# Patient Record
Sex: Female | Born: 1975 | Race: White | Hispanic: No | Marital: Married | Smoking: Never smoker
Health system: Southern US, Community
[De-identification: ages and names within clinical notes are randomized; demographics above are authoritative.]

## PROBLEM LIST (undated history)

## (undated) DIAGNOSIS — C4490 Unspecified malignant neoplasm of skin, unspecified: Secondary | ICD-10-CM

## (undated) HISTORY — DX: Unspecified malignant neoplasm of skin, unspecified: C44.90

## (undated) HISTORY — PX: ABDOMINAL HYSTERECTOMY: SHX81

## (undated) HISTORY — PX: SKIN CANCER EXCISION: SHX779

---

## 2004-11-27 ENCOUNTER — Observation Stay (HOSPITAL_COMMUNITY): Admission: RE | Admit: 2004-11-27 | Discharge: 2004-11-28 | Payer: Self-pay | Admitting: Obstetrics and Gynecology

## 2008-04-30 ENCOUNTER — Ambulatory Visit (HOSPITAL_COMMUNITY): Admission: RE | Admit: 2008-04-30 | Discharge: 2008-04-30 | Payer: Self-pay | Admitting: Obstetrics and Gynecology

## 2008-11-29 ENCOUNTER — Ambulatory Visit (HOSPITAL_COMMUNITY): Admission: RE | Admit: 2008-11-29 | Discharge: 2008-11-29 | Payer: Self-pay | Admitting: Obstetrics and Gynecology

## 2009-05-09 ENCOUNTER — Ambulatory Visit (HOSPITAL_COMMUNITY): Admission: RE | Admit: 2009-05-09 | Discharge: 2009-05-09 | Payer: Self-pay | Admitting: Obstetrics and Gynecology

## 2010-02-28 IMAGING — US US BREAST*L*
1 series · 6 of 6 positions shown · non-contrast
Comparison: 04/30/2008

CLINICAL DATA: The patient returns to evaluate the left breast.

LEFT BREAST ULTRASOUND

[Series 1: us breast*left* · 0.07mm/px · 6 of 6 slices shown]
[im 1/6]
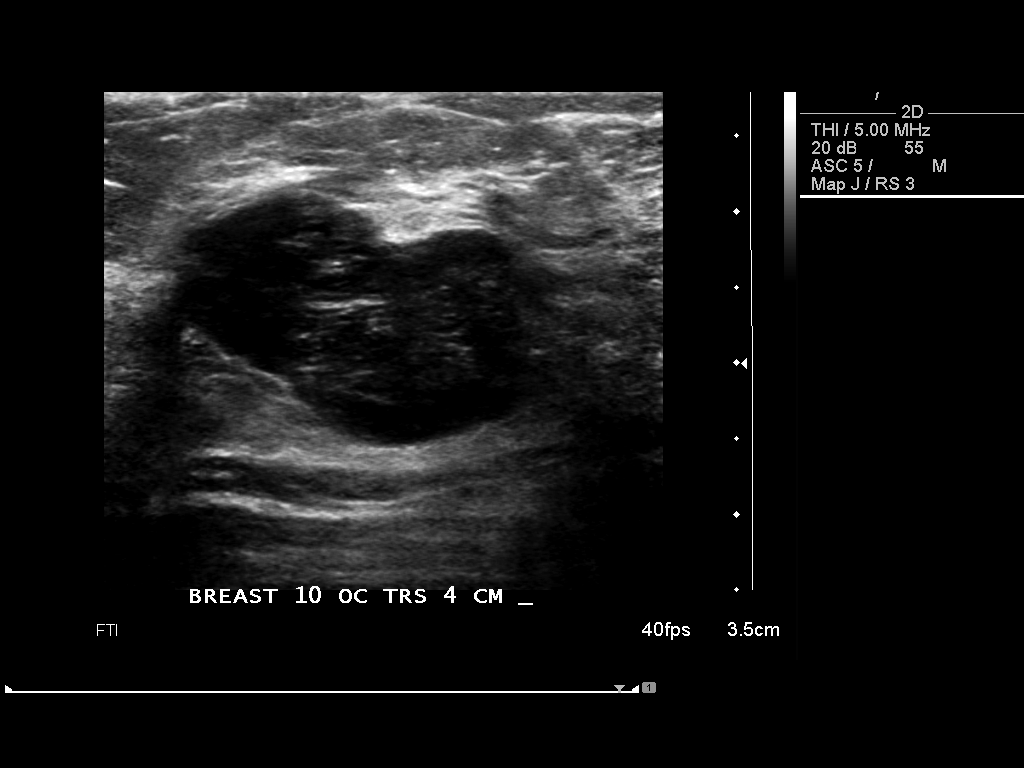
[im 2/6]
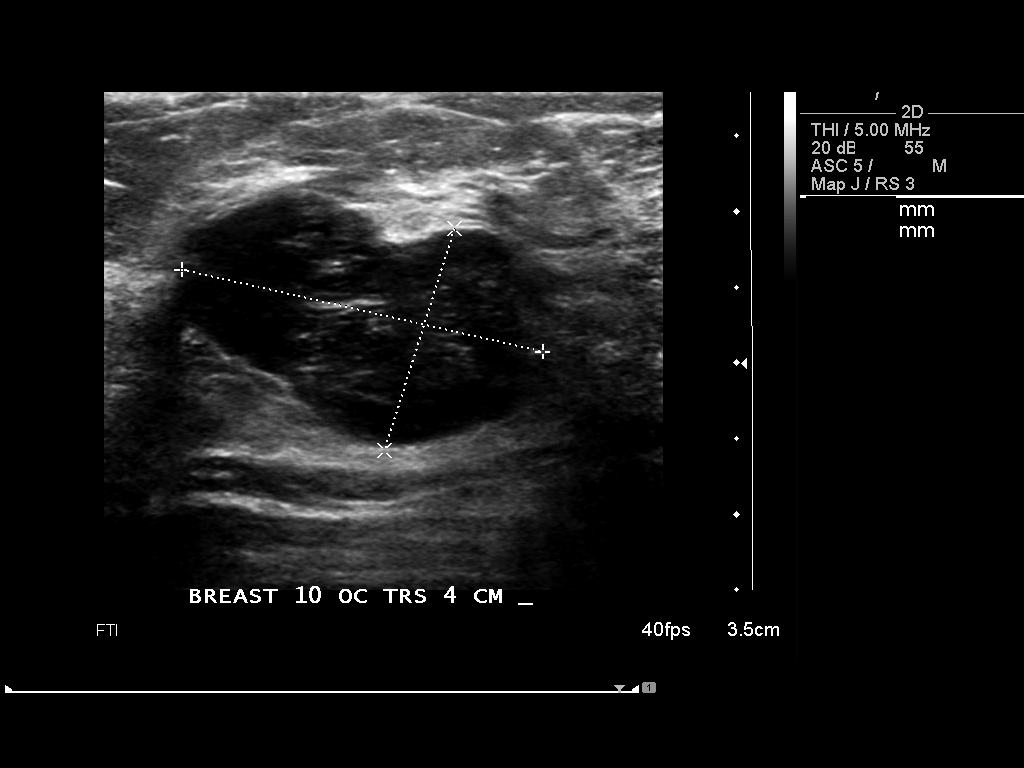
[im 3/6]
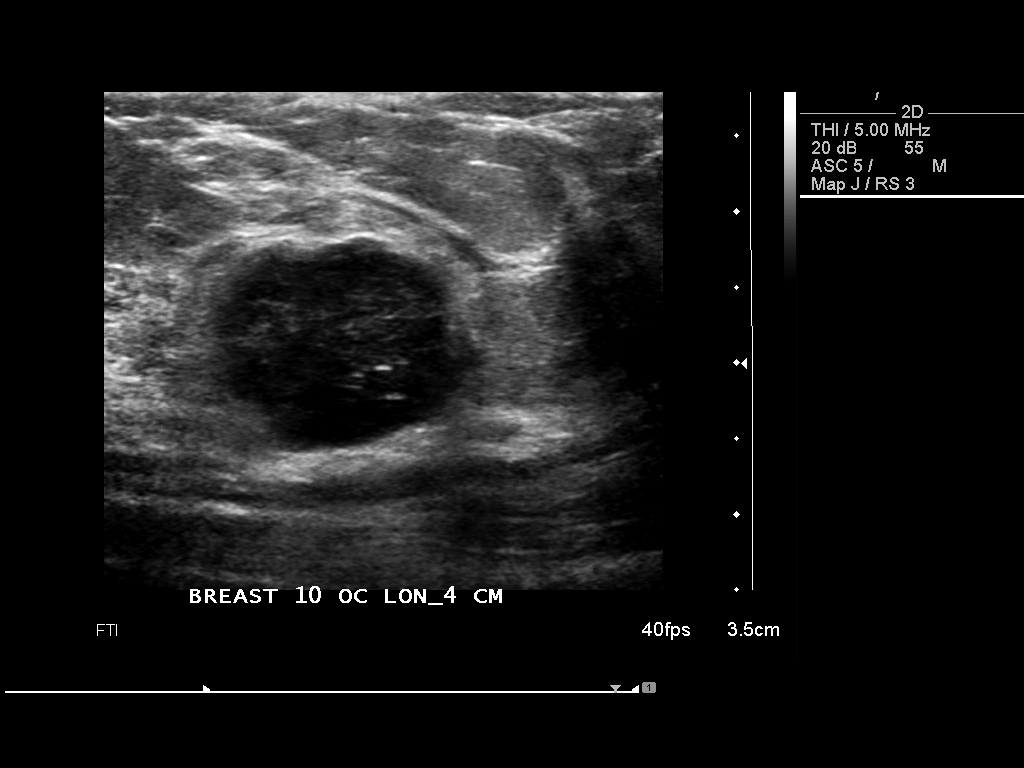
[im 4/6]
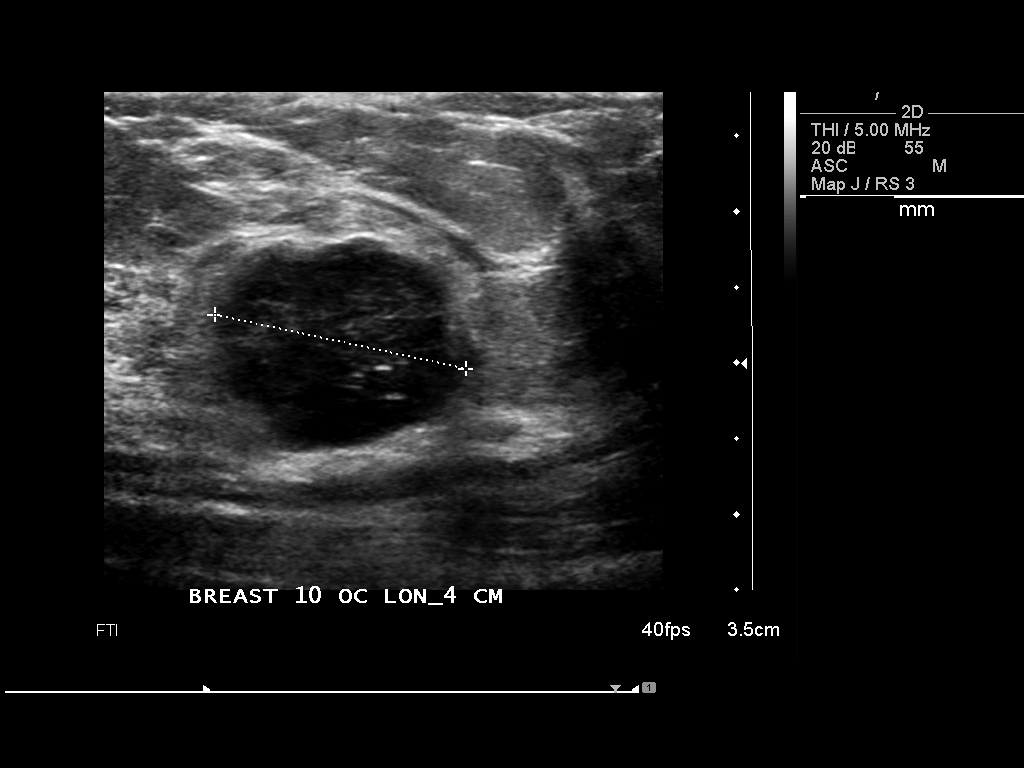
[im 5/6]
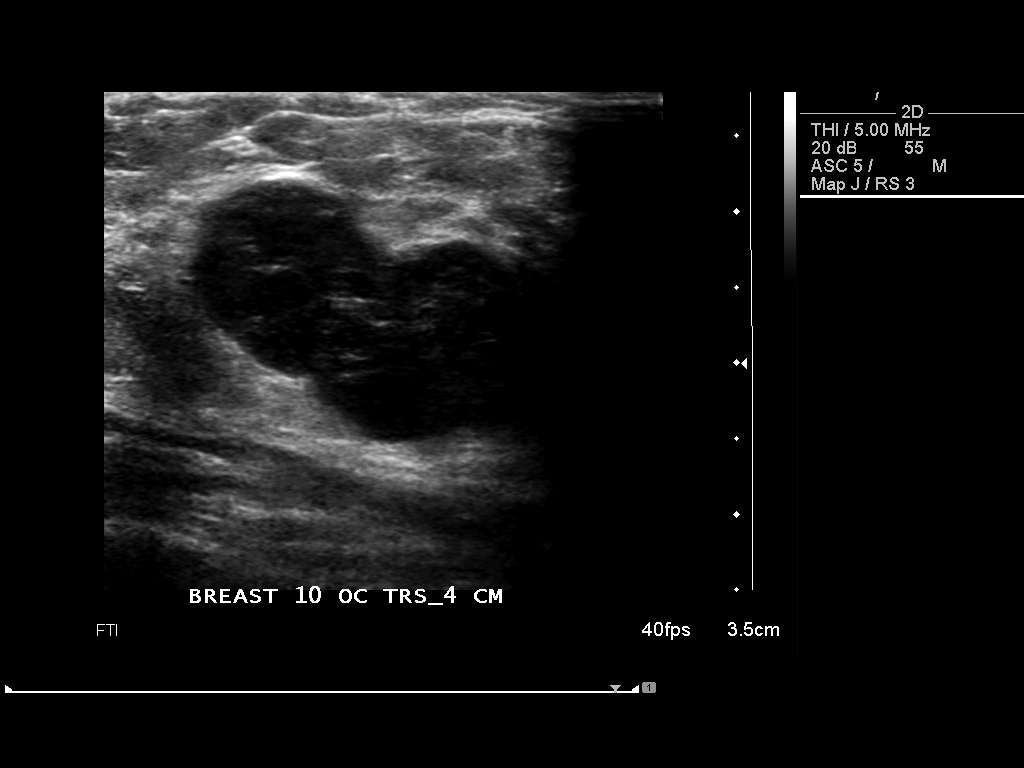
[im 6/6]
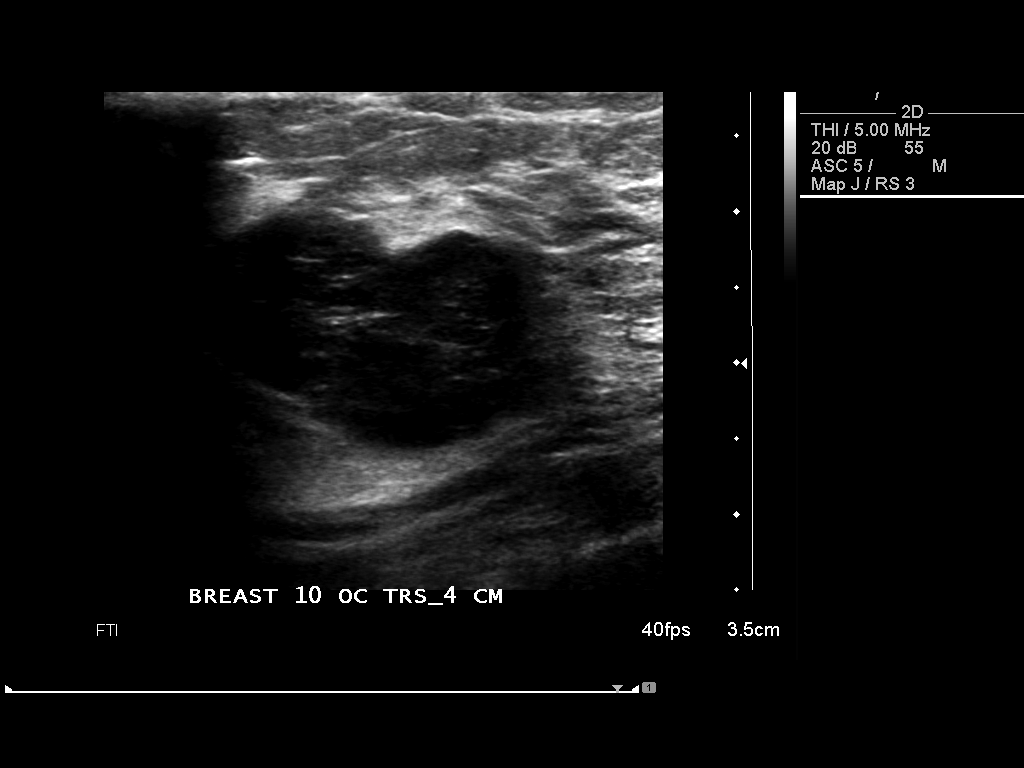

[6 of 6 positions shown; findings below may reference images not displayed]

On physical exam, I palpate a rounded mobile mass in the 10 o'clock
position of the left breast.
FINDINGS: Ultrasound is performed, showing a hypoechoic
circumscribed mass in the 10 o'clock position of the left breast 4
cm from the nipple.  This mass measures 2.4 x 1.5 x 1.7 cm and is
associated with increased through transmission.  Findings are
consistent with a benign fibroadenoma. No suspicious abnormalities
are identified within this portion of the breast.
IMPRESSION: Stable, benign appearing mass consistent with fibroadenoma.  Follow-
up left breast ultrasound is suggested in 6 months.

BI-RADS CATEGORY 3:  Probably benign finding(s) - short interval
follow-up suggested.

## 2011-01-30 NOTE — Op Note (Signed)
[name], [name]            ACCOUNT NO.:  [id number]   MEDICAL RECORD NO.:  [id number]          PATIENT TYPE:  OBV   LOCATION:  A402                          FACILITY:  [facility]   PHYSICIAN:  Tilda Burrow, M.D. DATE OF BIRTH:  29-Mar-1976   DATE OF PROCEDURE:  11/27/2004  DATE OF DISCHARGE:  11/28/2004                                 OPERATIVE REPORT   PREOPERATIVE DIAGNOSIS:  Pelvic relaxation, first degree uterine descensus,  dyspareunia.   POSTOPERATIVE DIAGNOSIS:  Pelvic relaxation, first degree uterine descensus,  dyspareunia.   PROCEDURE:  Vaginal hysterectomy.   SURGEON:  Tilda Burrow, M.D.   ASSISTANT:  __________, CST.   ANESTHESIA:  General.   COMPLICATIONS:  None.   ESTIMATED BLOOD LOSS:  Less than 200 mL.   FINDINGS:  A bulbous cervix, normal-appearing tubes and ovaries, no evidence  of endometriosis, relaxed pelvic structures.   DETAILS OF PROCEDURE:  The patient was taken to the operating room, prepped  and draped for vaginal procedure with legs supported in the high lithotomy  support position with candy-cane stirrups. After prepping and draping with  vaginal bib in place, single Lahey thyroid tenaculum was used to grasp the  anterior lip of the cervix. Anterior incision was made in the cervical  vaginal fornix from about 9 o'clock to 3 o'clock, and the bladder flap was  elevated upward. We could not enter the peritoneal cavity anteriorly but  were able to push the bladder quite high. Posteriorly, a posterior colpotomy  incision was made followed by placement of a long weighted speculum into the  posterior cul-de-sac. The left uterosacral ligament was clamped, cut, and  suture ligated with 0 chromic and tagged. The right uterosacral was treated  similarly. At this point, you could finish scoring the vaginal epithelium at  3 and 9 o'clock and completely expose the underlying cardial ligaments. The  lower cardinal ligaments were grasped on each side  with curved Heaney  clamps, transected with Mayo scissors, and sutured with 0 chromic. At this  point, reassessment of the anterior colpotomy incision potential was made,  and were able to enter anteriorly as well as posteriorly. A curved Zeppelin  clamp was then used across the upper cardinal ligament, clamping, cutting,  and suturing uterine vessels. Marched up the broad ligament using small  bites with a Zeppelin clamp, Mayo scissor transection, and 0 chromic suture  ligature. Upon reaching the level of the round ligament and ovarian  ligament, a cross clamping was performed across the tubal stump, round  ligament, uterosacral ligaments, and utero-ovarian ligament which were then  incorporated in a single pedicle. This was performed on either side and  tagged. At this point, we inspected the pelvis using sponge stick to abduct  lateral tissues along with lateral vaginal retractors. Hemostasis was used,  quite good. The ovaries were clearly visible, including ovulation stigmata  on one of the ovaries. We then proceeded with placement of a 3-0 Prolene  permanent monofilament suture high end of the uterosacrals on either side,  pulling them toward the midline. The vaginal peritoneum was closed fully  with bladder  posteriorly attaching over the cuff. The uterosacral ligaments  were then pulled in the midline and ligated. The remainder of the cuff was  closed in a vaginal fashion with good tissue support obtained. Sponge and  needle counts were correct, and patient then went to recovery room in good  condition.      JVF/MEDQ  D:  12/04/2004  T:  12/05/2004  Job:  [id number]   cc:   [facility]

## 2011-01-30 NOTE — H&P (Signed)
[name], [name]            ACCOUNT NO.:  [id number]   MEDICAL RECORD NO.:  [id number]          PATIENT TYPE:  AMB   LOCATION:                                 FACILITY:   PHYSICIAN:  Tilda Burrow, M.D. DATE OF BIRTH:  04-Feb-1976   DATE OF ADMISSION:  11/27/2004  DATE OF DISCHARGE:  LH                                HISTORY & PHYSICAL   ADMITTING DIAGNOSES:  1.  Dyspareunia.  2.  Uterine retroversion.  3.  Chronic back pain seemingly related to menses.  4.  Urinary frequency, responding to Ditropan (urge incontinence).   HISTORY OF PRESENT ILLNESS:  This 35 year old female, gravida 2, para 2,  with no desire for future childbearing, LMP November 12, 2004, is admitted at  this time for vaginal hysterectomy due to a combination of menstrual  discomfort during her periods, painful intercourse with spotting and  bleeding after intercourse.  She has had normal Pap smears.  She has had  ultrasound which evaluated the pelvic structures and showed a retroverted  retroflexed uterus measuring 5.5-cm in length with normal endometrial  stripe.  She has first degree uterine descensus on clinical exam.  There is  ovarian ultrasound showing normal ovaries bilaterally that are not the  source of pain.  She has been seen in our office, evaluated including  urinalysis which was negative.  Pelvic exam reproduces the dyspareunia by  tender uterine contact in the anterior cervical vaginal fornix that seems to  reproduce the dyspareunia in patient's perception.  She is not a candidate  for further birth control pill use to control her dysmenorrhea because all  birth control pills __________  effect libido.  The patient requests vaginal  hysterectomy for which she has been counseled and is considered reasonable  treatment.  She has a normal Pap smear, July 2005.  Negative GC and  Chlamydia cultures.  The patient had specific counseling to emphasize that  we can not absolutely guarantee resolution  of dyspareunia but that given the  cervix and uterus will be removed and those are the sources of presumed  discomfort, the likelihood is great that she will benefit.  She has had an  empiric trial of Ditropan for urinary frequency of 3-5 times nightly and  this reduced her nocturia to twice nightly.  She is satisfied with this.  She wears a pad only with a bad cold and high impact aerobics such as a  trampoline.  The possibility that complications could result in additional  surgery with specific mention of bladder or bowel as adjacent organs to be  concerned about has been discussed.   PAST MEDICAL HISTORY:  Benign.   SURGICAL HISTORY:  Negative.   INJURIES:  Broken ankle, 2004.   PHYSICAL EXAMINATION:  VITAL SIGNS:  Height 5 foot 4 inches, weight 168,  blood pressure 108/70, pulse 70s.  GENERAL:  She is a healthy appearing Caucasian female, alert, oriented,  appropriate, with acknowledged understanding or procedure.  Questions  answered to the patient's satisfaction is confirmed at day surgery November 26, 2004.  She has had a bowel prep.  HEENT:  Pupils equal, round, reactive.  NECK:  Supple.  Normal thyroid.  CHEST:  Clear to auscultation.  HEART:  Unremarkable.  LUNGS:  Unremarkable.  ABDOMEN:  Slim without masses.  EXTERNAL GENITALIA:  __________  first degree uterine descensus.  Uterus  retroverted.  Adnexa negative for masses.   REVIEW OF SYSTEMS:  Positive for chronic back pain times years with a  negative workup.  Dysmenorrhea results in back pain at the time of menses.  There has been no clinical history suggestive of endometriosis.   PLAN:  Vaginal hysterectomy, November 27, 2004.      JVF/MEDQ  D:  11/26/2004  T:  11/26/2004  Job:  [id number]   cc:   Tilda Burrow, M.D.  [address] [place]  [place] [address]  Fax: [phone]   Selinda Flavin  [address]  [place]  [place] [address]  Fax: [phone]

## 2013-05-08 ENCOUNTER — Ambulatory Visit (INDEPENDENT_AMBULATORY_CARE_PROVIDER_SITE_OTHER): Payer: BC Managed Care – PPO | Admitting: Obstetrics and Gynecology

## 2013-05-08 ENCOUNTER — Encounter: Payer: Self-pay | Admitting: Obstetrics and Gynecology

## 2013-05-08 VITALS — BP 110/82 | Ht 64.5 in | Wt 170.4 lb

## 2013-05-08 DIAGNOSIS — D242 Benign neoplasm of left breast: Secondary | ICD-10-CM

## 2013-05-08 DIAGNOSIS — Z01419 Encounter for gynecological examination (general) (routine) without abnormal findings: Secondary | ICD-10-CM

## 2013-05-08 NOTE — Progress Notes (Signed)
  Assessment:  Annual Gyn Exam Longstanding l breast fibroadenoma   Plan:  1. pap smear not required 2. return annually or prn 3   , would advise l br u/s Subjective:  [name] is a 37 y.o. female No obstetric history on file. who presents for annual exam. No LMP recorded. Patient has had a hysterectomy. The patient has complaints today of stable fibrodadenoma l breast, s/p hyst with vag dryness  The following portions of the patient's history were reviewed and updated as appropriate: allergies, current medications, past family history, past medical history, past social history, past surgical history and problem list.  Review of Systems Constitutional: negative Gastrointestinal: negative Genitourinary: vag dryness. No ui nocturia x3-4   Objective:  BP 110/82  Ht 5' 4.5" (1.638 m)  Wt 170 lb 6.4 oz (77.293 kg)  BMI 28.81 kg/m2   BMI: Body mass index is 28.81 kg/(m^2).  General Appearance: Alert, appropriate appearance for age. No acute distress HEENT: Grossly normal Neck / Thyroid:  Cardiovascular: RRR; normal S1, S2, no murmur Lungs: CTA bilaterally Back: No CVAT Breast Exam: lumps, left Gastrointestinal: Soft, non-tender, no masses or organomegaly Pelvic Exam: Vaginal: normal mucosa without prolapse or lesions Adnexa: normal bimanual exam Uterus: absent Rectovaginal: not indicated Lymphatic Exam: Non-palpable nodes in neck, clavicular, axillary, or inguinal regions Skin: no rash or abnormalities Neurologic: Normal gait and speech, no tremor  Psychiatric: Alert and oriented, appropriate affect.  Urinalysis:normal and Not done  Christin Bach. MD Pgr [phone] 12:16 PM

## 2014-09-14 HISTORY — PX: BREAST BIOPSY: SHX20

## 2015-02-21 ENCOUNTER — Ambulatory Visit (INDEPENDENT_AMBULATORY_CARE_PROVIDER_SITE_OTHER): Payer: BLUE CROSS/BLUE SHIELD | Admitting: Obstetrics and Gynecology

## 2015-02-21 ENCOUNTER — Encounter: Payer: Self-pay | Admitting: Obstetrics and Gynecology

## 2015-02-21 VITALS — BP 132/82 | HR 60 | Ht 63.0 in | Wt 193.2 lb

## 2015-02-21 DIAGNOSIS — Z01419 Encounter for gynecological examination (general) (routine) without abnormal findings: Secondary | ICD-10-CM

## 2015-02-21 DIAGNOSIS — Z139 Encounter for screening, unspecified: Secondary | ICD-10-CM

## 2015-02-21 NOTE — Progress Notes (Signed)
Patient ID: [name], female   DOB: [birth year], 39 y.o.   MRN: [id number]  Assessment:  Annual Gyn Exam Fibroadenoma of no concern at this time. Follow up with mammogram in December. Plan:  1. pap smear not done due to total hysterectomy. Not needed. 2. return annually or prn 3    Pt to have mammogram done before 39th birthday this year, in December, and send to me for report.  Subjective:  [name] is a 39 y.o. female with a history of total abdominal hysterectomy, who presents for annual exam. No LMP recorded. Patient has had a hysterectomy. The patient has complaints today of an area of concern on her left breast. Per chart review, patient had a [place] left breast in 2010 that was mostly benihn.  The following portions of the patient's history were reviewed and updated as appropriate: allergies, current medications, past family history, past medical history, past social history, past surgical history and problem list. Past Medical History  Diagnosis Date  . Skin cancer     Past Surgical History  Procedure Laterality Date  . Abdominal hysterectomy    . Skin cancer excision      No current outpatient prescriptions on file.  Review of Systems Constitutional: negative Gastrointestinal: negative Genitourinary: negative A complete review of systems was obtained and all systems are negative except as noted in the HPI and PMH.    Objective:  BP 132/82 mmHg  Pulse 60  Ht 5\' 3"  (1.6 m)  Wt 193 lb 3.2 oz (87.635 kg)  BMI 34.23 kg/m2   BMI: Body mass index is 34.23 kg/(m^2).  General Appearance: Alert, appropriate appearance for age. No acute distress HEENT: Grossly normal Neck / Thyroid:  Cardiovascular: RRR; normal S1, S2, no murmur Lungs: CTA bilaterally Back: No CVAT Breast Exam: No dimpling, nipple retraction or discharge. There is a small, 1.5 cm mass at 10 o'clock in the left breast, 1 cm away from the nipple edge, that is firm, smooth,  mobile Gastrointestinal: Soft, non-tender, no masses or organomegaly Pelvic Exam: Vulva and vagina appear normal. Bimanual exam reveals normal uterus and adnexa. External genitalia: normal general appearance Urinary system: urethral meatus normal Vaginal: normal mucosa without prolapse or lesions, normal without tenderness, induration or masses, normal rugae and excellent support Cervix: removed surgically Uterus: removed surgically Rectal: not indicated Rectovaginal: not indicated Lymphatic Exam: Non-palpable nodes in neck, clavicular, axillary, or inguinal regions  Skin: no rash or abnormalities Neurologic: Normal gait and speech, no tremor  Psychiatric: Alert and oriented, appropriate affect.  Urinalysis:Not done  Mallory Shirk. MD Pgr [phone] 11:01 AM      This chart was SCRIBED for Mallory Shirk, MD by Stephania Fragmin, ED Scribe. This patient was seen in room 1, and the patient's care was started at 10:59 AM.  I personally performed the services described in this documentation, which was SCRIBED in my presence. The recorded information has been reviewed and considered accurate. It has been edited as necessary during review. Tabitha Kind, MD

## 2015-02-22 LAB — CBC
HEMATOCRIT: 41 % (ref 34.0–46.6)
Hemoglobin: 14.1 g/dL (ref 11.1–15.9)
MCH: 32.6 pg (ref 26.6–33.0)
MCHC: 34.4 g/dL (ref 31.5–35.7)
MCV: 95 fL (ref 79–97)
PLATELETS: 275 10*3/uL (ref 150–379)
RBC: 4.33 x10E6/uL (ref 3.77–5.28)
RDW: 12.8 % (ref 12.3–15.4)
WBC: 7.3 10*3/uL (ref 3.4–10.8)

## 2015-02-22 LAB — LIPID PANEL
Chol/HDL Ratio: 4.6 ratio units — ABNORMAL HIGH (ref 0.0–4.4)
Cholesterol, Total: 256 mg/dL — ABNORMAL HIGH (ref 100–199)
HDL: 56 mg/dL (ref >39)
LDL CALC: 167 mg/dL — AB (ref 0–99)
Triglycerides: 164 mg/dL — ABNORMAL HIGH (ref 0–149)
VLDL CHOLESTEROL CAL: 33 mg/dL (ref 5–40)

## 2015-02-22 LAB — TSH: TSH: 2.35 u[IU]/mL (ref 0.450–4.500)

## 2015-08-27 ENCOUNTER — Other Ambulatory Visit: Payer: Self-pay | Admitting: Obstetrics and Gynecology

## 2015-08-27 DIAGNOSIS — D242 Benign neoplasm of left breast: Secondary | ICD-10-CM

## 2015-09-03 ENCOUNTER — Ambulatory Visit (HOSPITAL_COMMUNITY)
Admission: RE | Admit: 2015-09-03 | Discharge: 2015-09-03 | Disposition: A | Discharge status: Home / Self Care | Payer: BLUE CROSS/BLUE SHIELD | Source: Ambulatory Visit | Attending: Obstetrics and Gynecology | Admitting: Obstetrics and Gynecology

## 2015-09-03 ENCOUNTER — Other Ambulatory Visit: Payer: Self-pay | Admitting: Obstetrics and Gynecology

## 2015-09-03 DIAGNOSIS — D242 Benign neoplasm of left breast: Secondary | ICD-10-CM

## 2015-09-03 DIAGNOSIS — N63 Unspecified lump in breast: Secondary | ICD-10-CM | POA: Diagnosis not present

## 2015-09-10 ENCOUNTER — Ambulatory Visit (HOSPITAL_COMMUNITY)
Admission: RE | Admit: 2015-09-10 | Discharge: 2015-09-10 | Disposition: A | Discharge status: Home / Self Care | Payer: BLUE CROSS/BLUE SHIELD | Source: Ambulatory Visit | Attending: Obstetrics and Gynecology | Admitting: Obstetrics and Gynecology

## 2015-09-10 ENCOUNTER — Encounter (HOSPITAL_COMMUNITY): Payer: Self-pay

## 2015-09-10 ENCOUNTER — Other Ambulatory Visit: Payer: Self-pay | Admitting: Obstetrics and Gynecology

## 2015-09-10 DIAGNOSIS — N6320 Unspecified lump in the left breast, unspecified quadrant: Secondary | ICD-10-CM

## 2015-09-10 DIAGNOSIS — D242 Benign neoplasm of left breast: Secondary | ICD-10-CM | POA: Diagnosis not present

## 2015-09-10 DIAGNOSIS — N63 Unspecified lump in breast: Secondary | ICD-10-CM | POA: Diagnosis present

## 2015-09-10 MED ORDER — LIDOCAINE-EPINEPHRINE (PF) 1 %-1:200000 IJ SOLN
INTRAMUSCULAR | Status: AC
  Filled 2015-09-10: qty 10

## 2015-09-10 NOTE — Discharge Instructions (Signed)
Breast Biopsy °A breast biopsy is a test during which a sample of tissue is taken from your breast. The breast tissue is looked at under a microscope for cancer cells.  °BEFORE THE PROCEDURE °· Make plans to have someone drive you home after the test. °· Do not smoke for 2 weeks before the test. Stop smoking, if you smoke. °· Do not drink alcohol for 24 hours before the test. °· Wear a good support bra to the test. °· Your doctor may do a procedure to place a wire or a seed that gives off radiation in the breast lump. The wire or seed will help your doctor see the lump when doing the biopsy. °PROCEDURE  °You may be given one of the following: °· A medicine to numb the breast area (local anesthetic). °· A medicine to make you fall asleep (general anesthetic). °There are different types of breast biopsies. They include: °· Fine-needle aspiration. °¨ A needle is put into the breast lump. °¨ The needle takes out fluid and cells from the lump. °¨ Ultrasound imaging may be used to help find the lump and to put the needle in the right spot. °· Core-needle biopsy. °¨ A needle is put into the breast lump. °¨ The needle is put in your breast 3-6 times. °¨ The needle removes breast tissue. °¨ An ultrasound image or X-ray is often used to find the right spot to put in the needle. °· Stereotactic biopsy. °¨ X-rays and a computer are used to study X-ray pictures of the breast lump. °¨ The computer finds where the needle needs to be put into the breast. °¨ Tissue samples are taken out. °· Vacuum-assisted biopsy. °¨ A small cut (incision) is made in your breast. °¨ A biopsy device is put through the cut and into the breast tissue. °¨ The biopsy device draws abnormal breast tissue into the biopsy device. °¨ A large tissue sample is often removed. °¨ No stitches are needed. °· Ultrasound-guided core-needle biopsy. °¨ Ultrasound imaging helps guide the needle into the area of the breast that is not normal. °¨ A cut is made in the  breast. The needle is put into the breast lump. °¨ Tissue samples are taken out. °· Open biopsy. °¨ A large cut is made in the breast. °¨ Your doctor will try to remove the whole breast lump or as much as possible. °All tissue, fluid, or cell samples are looked at under a microscope.  °AFTER THE PROCEDURE °· You will be taken to an area to recover. You will be able to go home once you are doing well and are without problems. °· You may have bruising on your breast. This is normal. °· A pressure bandage (dressing) may be put on your breast for 24-48 hours. This type of bandage is wrapped tightly around your chest. It helps stop fluid from building up underneath tissues. °  °This information is not intended to replace advice given to you by your health care provider. Make sure you discuss any questions you have with your health care provider. °  °Document Released: 11/23/2011 Document Revised: 05/22/2015 Document Reviewed: 11/23/2011 °Elsevier Interactive Patient Education ©2016 [employer] °Breast Biopsy, Care After °These instructions give you information on caring for yourself after your procedure. Your doctor may also give you more specific instructions. Call your doctor if you have any problems or questions after your procedure. °HOME CARE °· Only take medicine as told by your doctor. °· Do not take aspirin. °·   Keep your sutures (stitches) dry when bathing. °· Protect the biopsy area. Do not let the area get bumped. °· Avoid activities that could pull the biopsy site open until your doctor approves. This includes: °· Stretching. °· Reaching. °· Exercise. °· Sports. °· Lifting more than 3lb. °· Continue your normal diet. °· Wear a good support bra for as long as told by your doctor. °· Change any bandages (dressings) as told by your doctor. °· Do not drink alcohol while taking pain medicine. °· Keep all doctor visits as told. Ask when your test results will be ready. Make sure you get your test results. °GET  HELP RIGHT AWAY IF:  °· You have a fever. °· You have more bleeding (more than a small spot) from the biopsy site. °· You have trouble breathing. °· You have yellowish-white fluid (pus) coming from the biopsy site. °· You have redness, puffiness (swelling), or more pain in the biopsy site. °· You have a bad smell coming from the biopsy site. °· Your biopsy site opens after sutures, staples, or sticky strips have been removed. °· You have a rash. °· You need stronger medicine. °MAKE SURE YOU: °· Understand these instructions. °· Will watch your condition. °· Will get help right away if you are not doing well or get worse. °  °This information is not intended to replace advice given to you by your health care provider. Make sure you discuss any questions you have with your health care provider. °  °Document Released: 06/27/2009 Document Revised: 09/21/2014 Document Reviewed: 10/11/2011 °Elsevier Interactive Patient Education ©2016 [employer] ° °

## 2016-11-10 ENCOUNTER — Other Ambulatory Visit: Payer: Self-pay | Admitting: Obstetrics and Gynecology

## 2016-11-10 DIAGNOSIS — Z1231 Encounter for screening mammogram for malignant neoplasm of breast: Secondary | ICD-10-CM

## 2016-11-25 ENCOUNTER — Ambulatory Visit (HOSPITAL_COMMUNITY): Payer: BLUE CROSS/BLUE SHIELD

## 2016-11-26 ENCOUNTER — Ambulatory Visit (HOSPITAL_COMMUNITY)
Admission: RE | Admit: 2016-11-26 | Discharge: 2016-11-26 | Disposition: A | Discharge status: Home / Self Care | Payer: BLUE CROSS/BLUE SHIELD | Source: Ambulatory Visit | Attending: Obstetrics and Gynecology | Admitting: Obstetrics and Gynecology

## 2016-11-26 DIAGNOSIS — Z1231 Encounter for screening mammogram for malignant neoplasm of breast: Secondary | ICD-10-CM | POA: Insufficient documentation

## 2016-12-09 IMAGING — US US BREAST BX W LOC DEV 1ST LESION IMG BX SPEC US GUIDE*L*
1 series · 10 of 10 positions shown · non-contrast
Comparison: Previous exam(s).

ADDENDUM:
Pathology of the left breast biopsy revealed a FIBROADENOMA WITH
FIBROCYSTIC CHANGES. NEGATIVE FOR ATYPIA AND MALIGNANCY. This was
found to be concordant by Dr. Har impression and notes.

At the patient's request, pathology and recommendations were relayed
to the patient by phone. She stated she has done well following the
biopsy with no bleeding, bruising, or hematoma. Post biopsy
instructions were reviewed with the patient. All of her questions
were answered. She was encouraged to call the imaging department of
[HOSPITAL] or Mak, Lula ([REDACTED]) with
Recommendations:  Recommend routine screening mammograms.
Pathology and recommendations relayed by Mak, Lula on
09/11/15.
CLINICAL DATA: 38-year-old female with a mass in the upper inner
left breast at the approximate 10 o'clock position.
EXAM:
ULTRASOUND GUIDED LEFT BREAST CORE NEEDLE BIOPSY

[Series 1: us breast bx w loc dev 1st lesion img bx spec us g · 0.07mm/px · 10 of 10 slices shown]
[im 1/10]
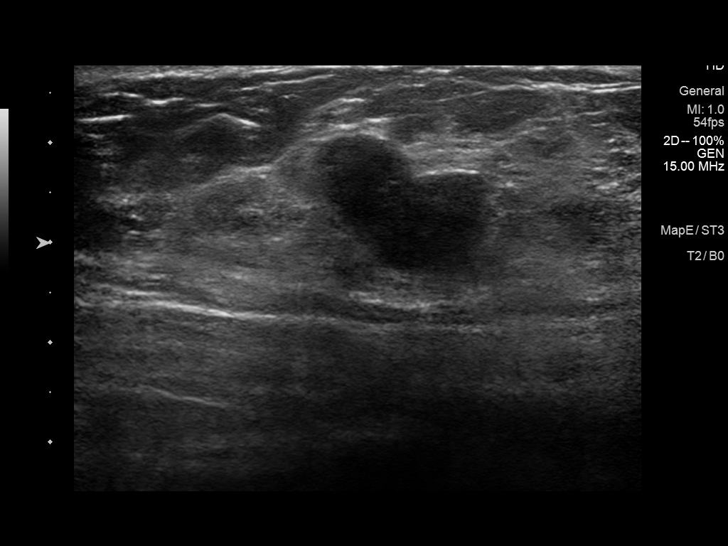
[im 2/10]
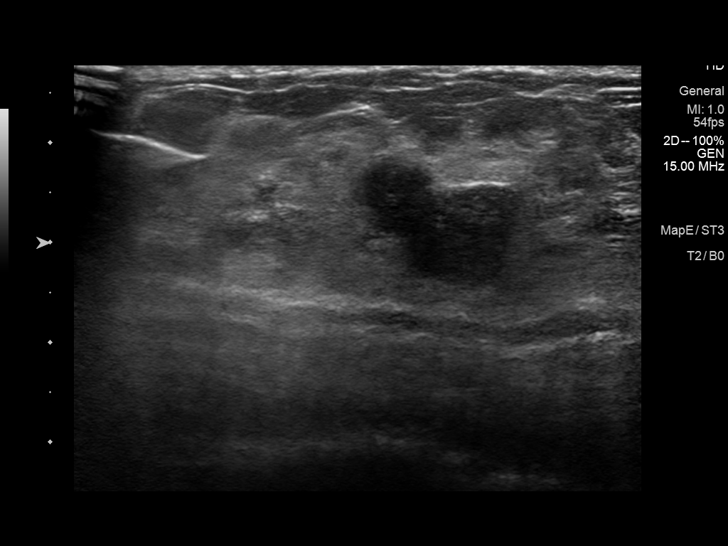
[im 3/10]
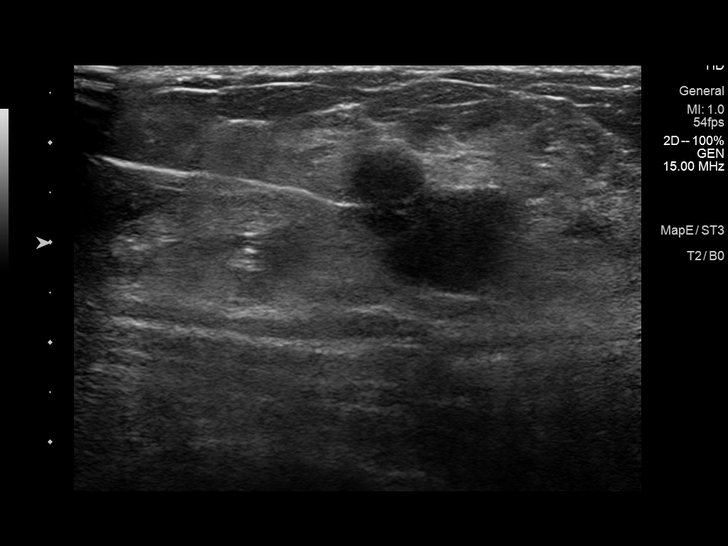
[im 4/10]
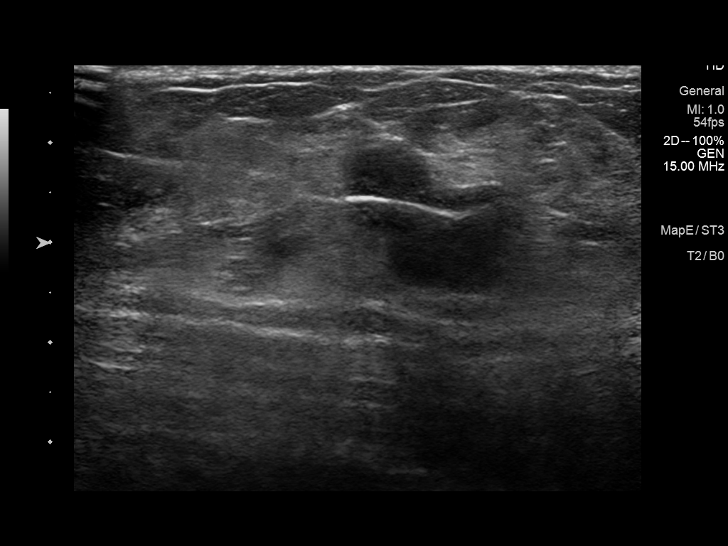
[im 5/10]
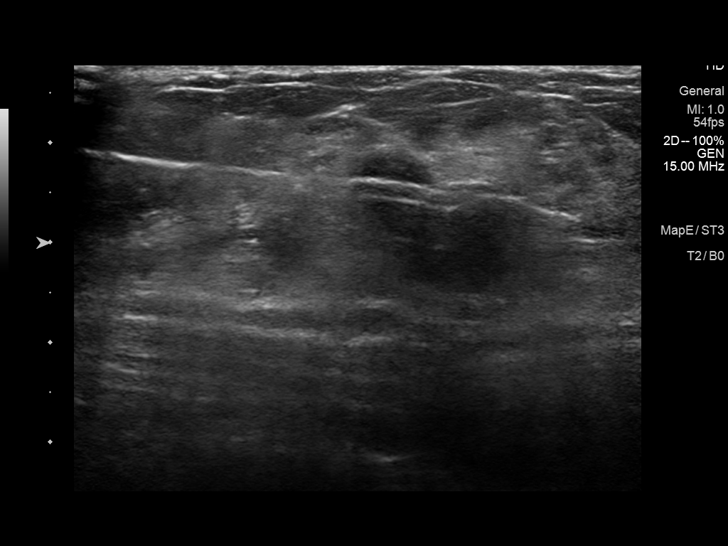
[im 6/10]
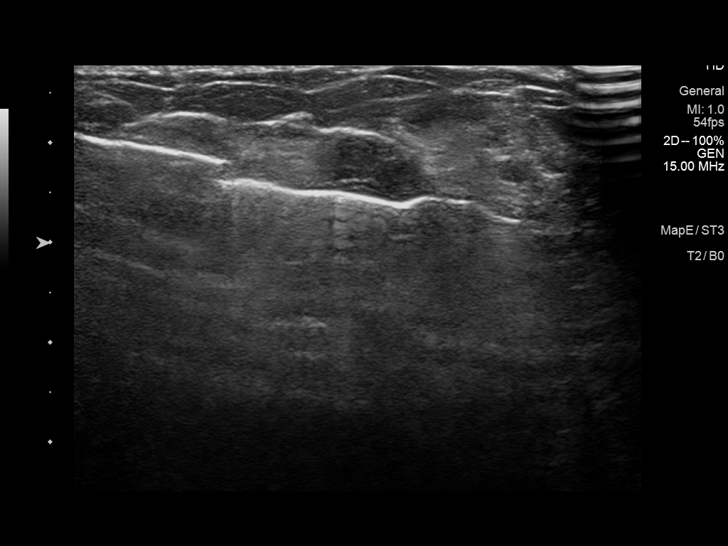
[im 7/10]
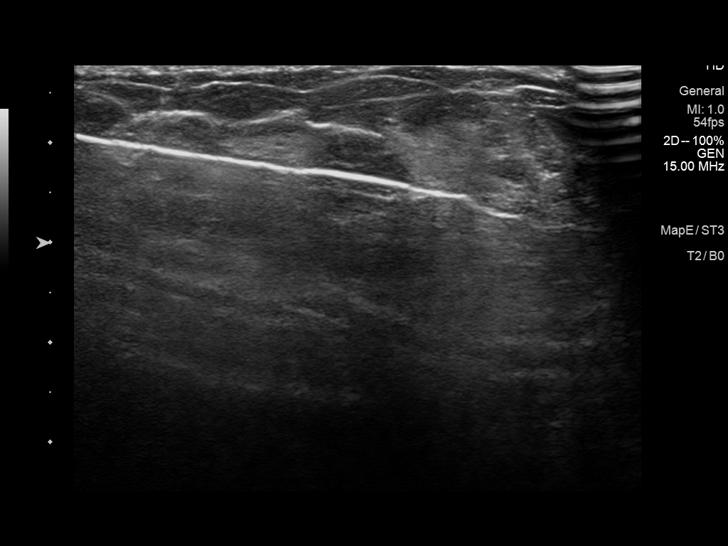
[im 8/10]
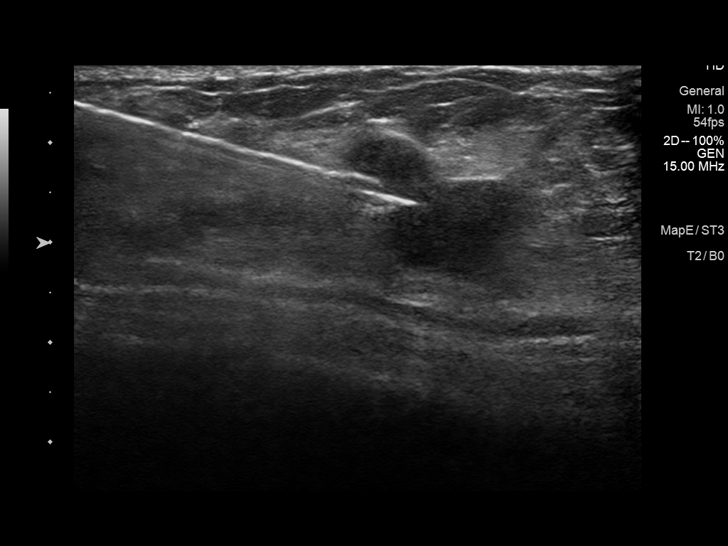
[im 9/10]
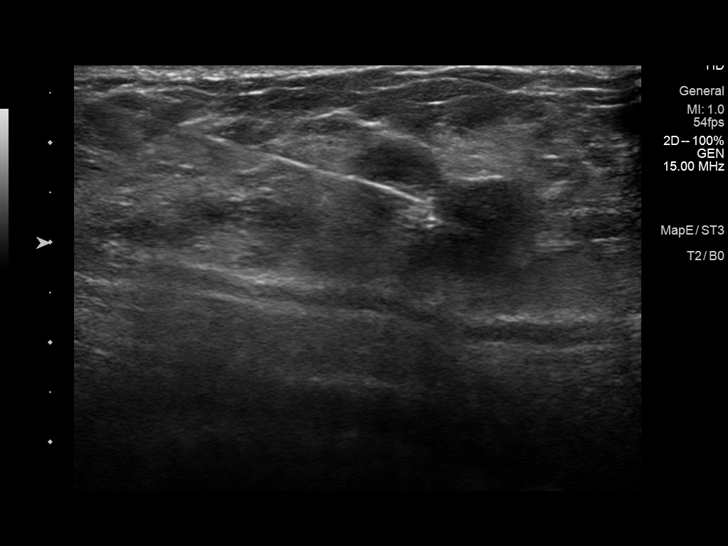
[im 10/10]
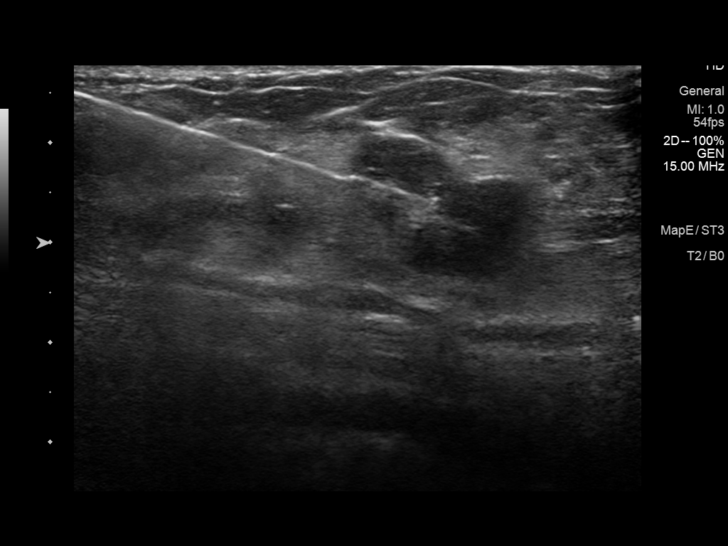

[10 of 10 positions shown; findings below may reference images not displayed]

PROCEDURE:
I met with the patient and we discussed the procedure of
ultrasound-guided biopsy, including benefits and alternatives. We
discussed the high likelihood of a successful procedure. We
discussed the risks of the procedure including infection, bleeding,
tissue injury, clip migration, and inadequate sampling. Informed
written consent was given. The usual time-out protocol was performed
immediately prior to the procedure.

Using sterile technique and 2% Lidocaine as local anesthetic, under
direct ultrasound visualization, a 12 gauge vacuum-assisted device
was used to perform biopsy of the mass in the left breast at the 10
o'clock positionusing a medial to lateral approach. At the
conclusion of the procedure, a wing shaped tissue marker clip was
deployed into the biopsy cavity. Follow-up 2-view mammogram was
performed and dictated separately.
IMPRESSION: Ultrasound-guided biopsy of the mass in the left breast at the 10
o'clock position. No apparent complications.

## 2016-12-18 ENCOUNTER — Emergency Department (HOSPITAL_COMMUNITY)
Admission: EM | Admit: 2016-12-18 | Discharge: 2016-12-19 | Disposition: A | Discharge status: Home / Self Care | Payer: BLUE CROSS/BLUE SHIELD | Attending: Emergency Medicine | Admitting: Emergency Medicine

## 2016-12-18 ENCOUNTER — Emergency Department (HOSPITAL_COMMUNITY): Payer: BLUE CROSS/BLUE SHIELD

## 2016-12-18 ENCOUNTER — Encounter (HOSPITAL_COMMUNITY): Payer: Self-pay | Admitting: *Deleted

## 2016-12-18 DIAGNOSIS — Z85828 Personal history of other malignant neoplasm of skin: Secondary | ICD-10-CM | POA: Insufficient documentation

## 2016-12-18 DIAGNOSIS — R110 Nausea: Secondary | ICD-10-CM | POA: Diagnosis not present

## 2016-12-18 DIAGNOSIS — R109 Unspecified abdominal pain: Secondary | ICD-10-CM

## 2016-12-18 DIAGNOSIS — R05 Cough: Secondary | ICD-10-CM | POA: Insufficient documentation

## 2016-12-18 DIAGNOSIS — R0781 Pleurodynia: Secondary | ICD-10-CM | POA: Insufficient documentation

## 2016-12-18 DIAGNOSIS — R1011 Right upper quadrant pain: Secondary | ICD-10-CM | POA: Insufficient documentation

## 2016-12-18 LAB — COMPREHENSIVE METABOLIC PANEL
ALBUMIN: 3.5 g/dL (ref 3.5–5.0)
ALK PHOS: 57 U/L (ref 38–126)
ALT: 15 U/L (ref 14–54)
ANION GAP: 6 (ref 5–15)
AST: 14 U/L — ABNORMAL LOW (ref 15–41)
BILIRUBIN TOTAL: 0.3 mg/dL (ref 0.3–1.2)
BUN: 9 mg/dL (ref 6–20)
CALCIUM: 8.5 mg/dL — AB (ref 8.9–10.3)
CO2: 27 mmol/L (ref 22–32)
Chloride: 106 mmol/L (ref 101–111)
Creatinine, Ser: 0.76 mg/dL (ref 0.44–1.00)
GFR calc Af Amer: >60 mL/min (ref >60)
GLUCOSE: 115 mg/dL — AB (ref 65–99)
Potassium: 4 mmol/L (ref 3.5–5.1)
Sodium: 139 mmol/L (ref 135–145)
TOTAL PROTEIN: 6.5 g/dL (ref 6.5–8.1)

## 2016-12-18 LAB — CBC WITH DIFFERENTIAL/PLATELET
BASOS PCT: 0 %
Basophils Absolute: 0 10*3/uL (ref 0.0–0.1)
Eosinophils Absolute: 0.1 10*3/uL (ref 0.0–0.7)
Eosinophils Relative: 0 %
HEMATOCRIT: 37.7 % (ref 36.0–46.0)
HEMOGLOBIN: 13 g/dL (ref 12.0–15.0)
LYMPHS ABS: 2.6 10*3/uL (ref 0.7–4.0)
LYMPHS PCT: 23 %
MCH: 32.5 pg (ref 26.0–34.0)
MCHC: 34.5 g/dL (ref 30.0–36.0)
MCV: 94.3 fL (ref 78.0–100.0)
MONOS PCT: 6 %
Monocytes Absolute: 0.7 10*3/uL (ref 0.1–1.0)
NEUTROS ABS: 8 10*3/uL — AB (ref 1.7–7.7)
NEUTROS PCT: 71 %
Platelets: 251 10*3/uL (ref 150–400)
RBC: 4 MIL/uL (ref 3.87–5.11)
RDW: 11.9 % (ref 11.5–15.5)
WBC: 11.5 10*3/uL — ABNORMAL HIGH (ref 4.0–10.5)

## 2016-12-18 LAB — URINALYSIS, ROUTINE W REFLEX MICROSCOPIC
Bilirubin Urine: NEGATIVE
Glucose, UA: NEGATIVE mg/dL
Hgb urine dipstick: NEGATIVE
KETONES UR: NEGATIVE mg/dL
LEUKOCYTES UA: NEGATIVE
NITRITE: NEGATIVE
PH: 6 (ref 5.0–8.0)
Protein, ur: NEGATIVE mg/dL
SPECIFIC GRAVITY, URINE: 1.014 (ref 1.005–1.030)

## 2016-12-18 LAB — LIPASE, BLOOD: Lipase: 23 U/L (ref 11–51)

## 2016-12-18 MED ORDER — IOPAMIDOL (ISOVUE-300) INJECTION 61%
100.0000 mL | Freq: Once | INTRAVENOUS | Status: AC | PRN
  Administered 2016-12-18: 100 mL via INTRAVENOUS

## 2016-12-18 MED ORDER — SODIUM CHLORIDE 0.9 % IV BOLUS (SEPSIS)
1000.0000 mL | Freq: Once | INTRAVENOUS | Status: AC
  Administered 2016-12-18: 1000 mL via INTRAVENOUS

## 2016-12-18 MED ORDER — OXYCODONE-ACETAMINOPHEN 5-325 MG PO TABS: 2.0000 | ORAL_TABLET | ORAL | 0 refills | Status: DC | PRN

## 2016-12-18 MED ORDER — HYDROMORPHONE HCL 1 MG/ML IJ SOLN
1.0000 mg | Freq: Once | INTRAMUSCULAR | Status: AC
  Administered 2016-12-18: 1 mg via INTRAVENOUS
  Filled 2016-12-18: qty 1

## 2016-12-18 MED ORDER — ONDANSETRON 4 MG PREPACK (~~LOC~~): 1.0000 | ORAL_TABLET | Freq: Three times a day (TID) | ORAL | 0 refills | Status: DC | PRN

## 2016-12-18 NOTE — ED Notes (Signed)
ED Provider at bedside. 

## 2016-12-18 NOTE — ED Triage Notes (Signed)
Pt reports right sided rib pain that radiates down her right side since Wednesday. Pt reports nausea and difficulty taking a deep breath.

## 2016-12-18 NOTE — ED Provider Notes (Signed)
[facility] DEPT Provider Note   CSN: [id number] Arrival date & time: 12/18/16  2010   By signing my name below, I, Hilbert Odor, attest that this documentation has been prepared under the direction and in the presence of Merrily Pew, MD. Electronically Signed: Hilbert Odor, Scribe. 12/18/16. 8:54 PM. History   Chief Complaint Chief Complaint  Patient presents with  . Chest Pain    The history is provided by the patient. No language interpreter was used.  HPI Comments: [name] is a 41 y.o. female who presents to the Emergency Department complaining of worsening right lateral chest pain and ruq abdominal pain for the past 2 days. She describes this pain as a "pressure" sensation. Pain worsens with deep breaths and palpation. She reports worsening pain with eating. She has never experienced this pain in the past. She also reports some cough and nausea recently. Her last known bowel movement was yesterday. She has not had period recently due to hysterectomy. She has no known medical problems. She denies any recent injuries, fevers, any urinary symptoms, and vomiting.  Past Medical History:  Diagnosis Date  . Skin cancer     Patient Active Problem List   Diagnosis Date Noted  . 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 ABDOMINAL HYSTERECTOMY    . SKIN CANCER EXCISION      OB History    No data available       Home Medications    Prior to Admission medications   Medication Sig Start Date End Date Taking? Authorizing Provider  triamcinolone (NASACORT ALLERGY 24HR) 55 MCG/ACT AERO nasal inhaler Place 1 spray into the nose 2 (two) times daily.   Yes Historical Provider, MD  ondansetron (ZOFRAN) 4 mg TABS tablet Take 4 tablets by mouth every 8 (eight) hours as needed. 12/18/16   Merrily Pew, MD  oxyCODONE-acetaminophen (PERCOCET/ROXICET) 5-325 MG tablet Take 2 tablets by mouth every 4 (four) hours  as needed for severe pain. 12/18/16   Merrily Pew, MD    Family History Family History  Problem Relation Age of Onset  . Cancer Mother     skin  . Hypertension Mother   . Hyperlipidemia Mother   . Cancer Maternal Grandmother     stomach  . Hypertension Maternal Grandmother   . Diabetes Maternal Grandfather   . Cancer Maternal Grandfather     penile cancer  . Stroke Maternal Grandfather   . Hypertension Maternal Grandfather   . Hypertension Paternal Grandmother     Social History Social History  Substance Use Topics  . Smoking status: Never Smoker  . Smokeless tobacco: Never Used  . Alcohol use Yes     Comment: on occ     Allergies   Patient has no known allergies.   Review of Systems Review of Systems  Constitutional: Negative for fever.  Respiratory: Positive for cough.   Cardiovascular: Positive for chest pain.  Gastrointestinal: Positive for abdominal pain and nausea. Negative for diarrhea and vomiting.  All other systems reviewed and are negative.   Physical Exam Updated Vital Signs BP 136/87   Pulse 72   Temp 98.3 F (36.8 C) (Oral)   Resp 18   Ht 5\' 4"  (1.626 m)   Wt 195 lb (88.5 kg)   SpO2 98%   BMI 33.47 kg/m   Physical Exam  Constitutional: She is oriented to person, place, and time. She appears well-developed and well-nourished.  HENT:  Head: Normocephalic.  Eyes: EOM are normal.  Neck: Normal range of motion.  Cardiovascular: Normal rate and regular rhythm.   Pulmonary/Chest: Effort normal.  Abdominal: She exhibits no distension and no mass. There is no rebound and no guarding.  RUQ tenderness. No rebound, guarding, or masses.  Musculoskeletal: Normal range of motion.  No CVA tenderness.  Neurological: She is alert and oriented to person, place, and time.  Skin: No rash noted.  No rashes on back.  Psychiatric: She has a normal mood and affect.  Nursing note and vitals reviewed.   ED Treatments / Results  DIAGNOSTIC  STUDIES: Oxygen Saturation is 100% on RA, normal by my interpretation.    COORDINATION OF CARE: 8:47 PM Discussed treatment plan with pt at bedside and pt agreed to plan. I will check the patient's CT scan and X-ray.  Labs (all labs ordered are listed, but only abnormal results are displayed) Labs Reviewed  CBC WITH DIFFERENTIAL/PLATELET - Abnormal; Notable for the following:       Result Value   WBC 11.5 (*)    Neutro Abs 8.0 (*)    All other components within normal limits  COMPREHENSIVE METABOLIC PANEL - Abnormal; Notable for the following:    Glucose, Bld 115 (*)    Calcium 8.5 (*)    AST 14 (*)    All other components within normal limits  URINALYSIS, ROUTINE W REFLEX MICROSCOPIC - Abnormal; Notable for the following:    APPearance HAZY (*)    All other components within normal limits  LIPASE, BLOOD    EKG  EKG Interpretation  Date/Time:  Friday December 18 2016 20:50:14 EDT Ventricular Rate:  83 PR Interval:    QRS Duration: 109 QT Interval:  376 QTC Calculation: 442 R Axis:   56 Text Interpretation:  Sinus rhythm Confirmed by Shaquinta Peruski MD, Corene Cornea [phone]) on 12/18/2016 8:51:43 PM       Radiology Dg Chest 2 View  Result Date: 12/18/2016 CLINICAL DATA:  Right anterior chest pain since Wednesday with cough EXAM: CHEST  2 VIEW COMPARISON:  None. FINDINGS: The heart size and mediastinal contours are within normal limits. Both lungs are clear. The visualized skeletal structures are unremarkable. IMPRESSION: No active cardiopulmonary disease. Electronically Signed   By: Ashley Royalty M.D.   On: 12/18/2016 20:43   Ct Abdomen Pelvis W Contrast  Result Date: 12/18/2016 CLINICAL DATA:  Right-sided rib pain radiating to the right thigh EXAM: CT ABDOMEN AND PELVIS WITH CONTRAST TECHNIQUE: Multidetector CT imaging of the abdomen and pelvis was performed using the standard protocol following bolus administration of intravenous contrast. CONTRAST:  128mL ISOVUE-300 IOPAMIDOL (ISOVUE-300)  INJECTION 61% COMPARISON:  None. FINDINGS: Lower chest: Lung bases demonstrate no acute consolidation or effusion. Patchy dependent atelectasis. The heart does not appear enlarged. Hepatobiliary: No focal liver abnormality is seen. No gallstones, gallbladder wall thickening, or biliary dilatation. Pancreas: Unremarkable. No pancreatic ductal dilatation or surrounding inflammatory changes. Spleen: Normal in size without focal abnormality. Adrenals/Urinary Tract: Adrenal glands are within normal limits. Suspect left parapelvic cysts. Bladder unremarkable. Stomach/Bowel: Stomach is within normal limits. Appendix appears normal. No evidence of bowel wall thickening, distention, or inflammatory changes. Vascular/Lymphatic: Aortic atherosclerosis. No enlarged abdominal or pelvic lymph nodes. Reproductive: Status post hysterectomy. 3.3 cm cyst in the right ovary. Other: No free air or free fluid. Small fat containing umbilical hernia Musculoskeletal: No acute osseous abnormality. Partially visualized sclerotic lesion in T5. IMPRESSION: 1. No CT evidence for acute intra-abdominal or pelvic pathology. Normal appendix.  2. Probable left renal parapelvic cysts 3. 3.3 cm right ovarian cyst Electronically Signed   By: Donavan Foil M.D.   On: 12/18/2016 23:25    Procedures Procedures (including critical care time)  Medications Ordered in ED Medications  HYDROmorphone (DILAUDID) injection 1 mg (1 mg Intravenous Given 12/18/16 2114)  sodium chloride 0.9 % bolus 1,000 mL (0 mLs Intravenous Stopped 12/18/16 2239)  iopamidol (ISOVUE-300) 61 % injection 100 mL (100 mLs Intravenous Contrast Given 12/18/16 2250)     Initial Impression / Assessment and Plan / ED Course  I have reviewed the triage vital signs and the nursing notes.  Pertinent labs & imaging results that were available during my care of the patient were reviewed by me and considered in my medical decision making (see chart for details).     Ct ok - doubt  appy, colitis, enteritis Some pleuritic component but no RF, low wells, doubt PE Still with ruq ttp - will dc on zofran/percocet, will return in AM for RUQ [place], if negative will fu w/ pcp.    Final Clinical Impressions(s) / ED Diagnoses   Final diagnoses:  Abdominal pain, unspecified abdominal location    New Prescriptions New Prescriptions   ONDANSETRON (ZOFRAN) 4 MG TABS TABLET    Take 4 tablets by mouth every 8 (eight) hours as needed.   OXYCODONE-ACETAMINOPHEN (PERCOCET/ROXICET) 5-325 MG TABLET    Take 2 tablets by mouth every 4 (four) hours as needed for severe pain.   I personally performed the services described in this documentation, which was scribed in my presence. The recorded information has been reviewed and is accurate.    Merrily Pew, MD 12/19/16 [phone]

## 2016-12-19 ENCOUNTER — Ambulatory Visit (HOSPITAL_COMMUNITY)
Admit: 2016-12-19 | Discharge: 2016-12-19 | Disposition: A | Discharge status: Home / Self Care | Payer: BLUE CROSS/BLUE SHIELD | Source: Ambulatory Visit | Attending: Emergency Medicine | Admitting: Emergency Medicine

## 2016-12-19 DIAGNOSIS — R110 Nausea: Secondary | ICD-10-CM | POA: Insufficient documentation

## 2016-12-19 DIAGNOSIS — R1011 Right upper quadrant pain: Secondary | ICD-10-CM

## 2016-12-23 MED FILL — Ondansetron HCl Tab 4 MG: ORAL | Qty: 4 | Status: AC

## 2016-12-23 MED FILL — Oxycodone w/ Acetaminophen Tab 5-325 MG: ORAL | Qty: 6 | Status: AC

## 2017-03-25 ENCOUNTER — Encounter (INDEPENDENT_AMBULATORY_CARE_PROVIDER_SITE_OTHER): Payer: Self-pay | Admitting: Internal Medicine

## 2017-03-31 ENCOUNTER — Encounter (INDEPENDENT_AMBULATORY_CARE_PROVIDER_SITE_OTHER): Payer: Self-pay | Admitting: Internal Medicine

## 2017-03-31 ENCOUNTER — Ambulatory Visit (INDEPENDENT_AMBULATORY_CARE_PROVIDER_SITE_OTHER): Payer: BLUE CROSS/BLUE SHIELD | Admitting: Internal Medicine

## 2017-03-31 VITALS — BP 122/86 | HR 68 | Temp 98.0°F | Resp 16 | Ht 64.0 in | Wt 199.0 lb

## 2017-03-31 DIAGNOSIS — R1011 Right upper quadrant pain: Secondary | ICD-10-CM | POA: Diagnosis not present

## 2017-03-31 LAB — CBC WITH DIFFERENTIAL/PLATELET
BASOS PCT: 0 %
Basophils Absolute: 0 cells/uL (ref 0–200)
EOS PCT: 1 %
Eosinophils Absolute: 77 cells/uL (ref 15–500)
HCT: 42 % (ref 35.0–45.0)
Hemoglobin: 14.2 g/dL (ref 11.7–15.5)
LYMPHS PCT: 23 %
Lymphs Abs: 1771 cells/uL (ref 850–3900)
MCH: 32.7 pg (ref 27.0–33.0)
MCHC: 33.8 g/dL (ref 32.0–36.0)
MCV: 96.8 fL (ref 80.0–100.0)
MONOS PCT: 7 %
MPV: 11.8 fL (ref 7.5–12.5)
Monocytes Absolute: 539 cells/uL (ref 200–950)
NEUTROS ABS: 5313 {cells}/uL (ref 1500–7800)
Neutrophils Relative %: 69 %
PLATELETS: 272 10*3/uL (ref 140–400)
RBC: 4.34 MIL/uL (ref 3.80–5.10)
RDW: 12.6 % (ref 11.0–15.0)
WBC: 7.7 10*3/uL (ref 3.8–10.8)

## 2017-03-31 LAB — HEPATIC FUNCTION PANEL
ALT: 15 U/L (ref 6–29)
AST: 13 U/L (ref 10–30)
Albumin: 3.9 g/dL (ref 3.6–5.1)
Alkaline Phosphatase: 57 U/L (ref 33–115)
BILIRUBIN DIRECT: 0.1 mg/dL (ref <=0.2)
BILIRUBIN INDIRECT: 0.3 mg/dL (ref 0.2–1.2)
BILIRUBIN TOTAL: 0.4 mg/dL (ref 0.2–1.2)
TOTAL PROTEIN: 6.7 g/dL (ref 6.1–8.1)

## 2017-03-31 NOTE — Progress Notes (Addendum)
   Subjective:    Patient ID: [name], female    DOB: Dec 25, 1975, 41 y.o.   MRN: [id number]  HPI Referred by Dr. Nadara Mustard for RUQ pain. Symptoms since April of this year. Seen in ED for same in April. CT scan was normal.  [place] RUQ was normal.  CBD was 76m.  She states she is still having the pain. She saw WAggie HackerPA-C and she had a HIDA scan and it was normal.  She says sometimes it feels like a pinch in her rt upper quadrant.  She has nausea after she eats. If she sits or moves a certain way, she will have the pain.   No acid reflux. She has some fullness. There has been no weight loss. Appetite is good. No weight loss. Has a BM x 1 a day.  Hepatic function normal. Normal H and H.      CBC    Component Value Date/Time   WBC 11.5 (H) 12/18/2016 2151   RBC 4.00 12/18/2016 2151   HGB 13.0 12/18/2016 2151   HGB 14.1 02/21/2015 1124   HCT 37.7 12/18/2016 2151   HCT 41.0 02/21/2015 1124   PLT 251 12/18/2016 2151   PLT 275 02/21/2015 1124   MCV 94.3 12/18/2016 2151   MCV 95 02/21/2015 1124   MCH 32.5 12/18/2016 2151   MCHC 34.5 12/18/2016 2151   RDW 11.9 12/18/2016 2151   RDW 12.8 02/21/2015 1124   LYMPHSABS 2.6 12/18/2016 2151   MONOABS 0.7 12/18/2016 2151   EOSABS 0.1 12/18/2016 2151   BASOSABS 0.0 12/18/2016 2151     Hepatic Function Latest Ref Rng & Units 12/18/2016  Total Protein 6.5 - 8.1 g/dL 6.5  Albumin 3.5 - 5.0 g/dL 3.5  AST 15 - 41 U/L 14(L)  ALT 14 - 54 U/L 15  Alk Phosphatase 38 - 126 U/L 57  Total Bilirubin 0.3 - 1.2 mg/dL 0.3       Review of Systems  Past Medical History:  Diagnosis Date  . Skin cancer     Past Surgical History:  Procedure Laterality Date  . ABDOMINAL HYSTERECTOMY    . SKIN CANCER EXCISION      No Known Allergies  Current Outpatient Prescriptions on File Prior to Visit  Medication Sig Dispense Refill  . triamcinolone (NASACORT ALLERGY 24HR) 55 MCG/ACT AERO nasal inhaler Place 1 spray into the nose 2 (two) times  daily.     No current facility-administered medications on file prior to visit.         Objective:   Physical Exam Blood pressure 122/86, pulse 68, temperature 98 F (36.7 C), resp. rate 16, height _0  (1.626 m), weight 199 lb (90.3 kg). Alert and oriented. Skin warm and dry. Oral mucosa is moist.   . Sclera anicteric, conjunctivae is pink. Thyroid not enlarged. No cervical lymphadenopathy. Lungs clear. Heart regular rate and rhythm.  Abdomen is soft. Bowel sounds are positive. No hepatomegaly. No abdominal masses felt. No tenderness.  No edema to lower extremities.          Assessment & Plan:  RUQ pain. HIDA scan,  CT scan and [place]. Hepatic function normal. Will get a CBC and Hepatic function today.

## 2017-03-31 NOTE — Patient Instructions (Signed)
Labs today

## 2017-04-02 ENCOUNTER — Encounter (INDEPENDENT_AMBULATORY_CARE_PROVIDER_SITE_OTHER): Payer: Self-pay

## 2017-04-07 ENCOUNTER — Encounter (INDEPENDENT_AMBULATORY_CARE_PROVIDER_SITE_OTHER): Payer: Self-pay | Admitting: Internal Medicine

## 2017-04-07 NOTE — Progress Notes (Signed)
Patient was given an appointment for 07/02/17 at 9:30am with Deberah Castle, NP.  A letter was mailed to the patient.

## 2017-05-10 NOTE — Progress Notes (Signed)
Would love the tips

## 2017-05-10 NOTE — Progress Notes (Signed)
k

## 2017-07-02 ENCOUNTER — Ambulatory Visit (INDEPENDENT_AMBULATORY_CARE_PROVIDER_SITE_OTHER): Payer: BLUE CROSS/BLUE SHIELD | Admitting: Internal Medicine

## 2017-11-03 ENCOUNTER — Encounter: Payer: Self-pay | Admitting: Obstetrics and Gynecology

## 2017-11-03 ENCOUNTER — Ambulatory Visit (INDEPENDENT_AMBULATORY_CARE_PROVIDER_SITE_OTHER): Payer: BLUE CROSS/BLUE SHIELD | Admitting: Obstetrics and Gynecology

## 2017-11-03 VITALS — BP 130/80 | HR 86 | Ht 64.0 in | Wt 192.0 lb

## 2017-11-03 DIAGNOSIS — N83202 Unspecified ovarian cyst, left side: Secondary | ICD-10-CM | POA: Diagnosis not present

## 2017-11-03 DIAGNOSIS — Z01419 Encounter for gynecological examination (general) (routine) without abnormal findings: Secondary | ICD-10-CM

## 2017-11-03 DIAGNOSIS — K429 Umbilical hernia without obstruction or gangrene: Secondary | ICD-10-CM

## 2017-11-03 DIAGNOSIS — N6002 Solitary cyst of left breast: Secondary | ICD-10-CM | POA: Diagnosis not present

## 2017-11-03 DIAGNOSIS — L739 Follicular disorder, unspecified: Secondary | ICD-10-CM | POA: Diagnosis not present

## 2017-11-03 DIAGNOSIS — Z01411 Encounter for gynecological examination (general) (routine) with abnormal findings: Secondary | ICD-10-CM

## 2017-11-03 MED ORDER — DOXYCYCLINE HYCLATE 100 MG PO CAPS: 100.0000 mg | ORAL_CAPSULE | Freq: Two times a day (BID) | ORAL | 1 refills | Status: DC

## 2017-11-03 MED ORDER — DOXYCYCLINE HYCLATE 100 MG PO CAPS: 100.0000 mg | ORAL_CAPSULE | Freq: Two times a day (BID) | ORAL | 1 refills | Status: AC

## 2017-11-03 NOTE — Progress Notes (Signed)
Patient ID: [name], female   DOB: [birth year], 42 y.o.   MRN: [id number]   [facility] Visit  @DATE @            Patient name: [name] MRN [id number]  Date of birth: 09-22-1975  CC & HPI:  [name] is a 42 y.o. female presenting today for a gyn exam. She also reports that she was told she has a left ovarian cyst that will cause her intermittent left flank pain. She also reports purple blisters when shaving. She reports her bladder has not been giving her any issues. Overall, the patient has been doing well. The patient denies fever, chills or any other symptoms or complaints at this time.   ROS:  ROS +ovarian cyst -fever -chills All systems are negative except as noted in the HPI and PMH.   Pertinent History Reviewed:   Reviewed: Significant for abdominal hysterectomy, skin cancer Medical         Past Medical History:  Diagnosis Date  . Skin cancer                               Surgical Hx:    Past Surgical History:  Procedure Laterality Date  . ABDOMINAL HYSTERECTOMY    . SKIN CANCER EXCISION     Medications: Reviewed & Updated - see associated section                       Current Outpatient Medications:  Marland Kitchen  GuaiFENesin (MUCINEX PO), Take by mouth every 4 (four) hours., Disp: , Rfl:  .  triamcinolone (NASACORT ALLERGY 24HR) 55 MCG/ACT AERO nasal inhaler, Place 2 sprays into the nose 2 (two) times daily. , Disp: , Rfl:  .  omeprazole (PRILOSEC) 40 MG capsule, Take by mouth., Disp: , Rfl:    Social History: Reviewed -  reports that  has never smoked. she has never used smokeless tobacco.  Objective Findings:  Vitals: Blood pressure 130/80, pulse 86, height 5\' 4"  (1.626 m), weight 192 lb (87.1 kg).  PHYSICAL EXAMINATION General appearance - alert, well appearing, and in no distress, oriented to person, place, and time and overweight Mental status - alert, oriented to person, place, and time, normal mood, behavior, speech, dress, motor  activity, and thought processes, affect appropriate to mood Abdomen - 1 cm reducible umbilica hernia Breasts - small 1 cm, left upper inner quadrant left breast cyst  Neck: Thyroid diffusely enlarged patient reports this is been noted before with negative workup PELVIC External genitalia - several small areas of folliculitis on the inner thighs of both legs Vagina - 5 mm inclusion cyst on the left vaginal side wall about 8 cm in the vagina, good support Cervix - surgically absent Uterus - surgically absent Rectal - reasonable support, hemoccult negative    Assessment & Plan:   A:  1. Vulvar Folliculitis 2. Well Women Exam s/p hysterectomy   P:  1. Rx Keflex times 7 days 4 times daily 2. F/u 3 years GYN Exam 3. Baseline labs, TSH, HGB   By signing my name below, I, Margit Banda, attest that this documentation has been prepared under the direction and in the presence of Jonnie Kind, MD. Electronically Signed: Margit Banda, Medical Scribe. 11/03/17. 11:29 AM.  I personally performed the services described in this documentation, which was SCRIBED in my presence. The recorded information has been reviewed  and considered accurate. It has been edited as necessary during review. Jonnie Kind, MD

## 2017-11-04 LAB — COMPREHENSIVE METABOLIC PANEL
ALBUMIN: 4.4 g/dL (ref 3.5–5.5)
ALT: 36 IU/L — AB (ref 0–32)
AST: 23 IU/L (ref 0–40)
Albumin/Globulin Ratio: 1.4 (ref 1.2–2.2)
Alkaline Phosphatase: 78 IU/L (ref 39–117)
BUN/Creatinine Ratio: 13 (ref 9–23)
BUN: 11 mg/dL (ref 6–24)
Bilirubin Total: <0.2 mg/dL (ref 0.0–1.2)
CALCIUM: 9.7 mg/dL (ref 8.7–10.2)
CO2: 24 mmol/L (ref 20–29)
Chloride: 104 mmol/L (ref 96–106)
Creatinine, Ser: 0.82 mg/dL (ref 0.57–1.00)
GFR, EST AFRICAN AMERICAN: 102 mL/min/{1.73_m2} (ref >59)
GFR, EST NON AFRICAN AMERICAN: 89 mL/min/{1.73_m2} (ref >59)
GLUCOSE: 97 mg/dL (ref 65–99)
Globulin, Total: 3.1 g/dL (ref 1.5–4.5)
Potassium: 5.4 mmol/L — ABNORMAL HIGH (ref 3.5–5.2)
Sodium: 143 mmol/L (ref 134–144)
TOTAL PROTEIN: 7.5 g/dL (ref 6.0–8.5)

## 2017-11-04 LAB — HEMOGLOBIN A1C
Est. average glucose Bld gHb Est-mCnc: 117 mg/dL
HEMOGLOBIN A1C: 5.7 % — AB (ref 4.8–5.6)

## 2017-11-04 LAB — LIPID PANEL
CHOL/HDL RATIO: 5.4 ratio — AB (ref 0.0–4.4)
Cholesterol, Total: 249 mg/dL — ABNORMAL HIGH (ref 100–199)
HDL: 46 mg/dL (ref >39)
LDL Calculated: 154 mg/dL — ABNORMAL HIGH (ref 0–99)
TRIGLYCERIDES: 244 mg/dL — AB (ref 0–149)
VLDL Cholesterol Cal: 49 mg/dL — ABNORMAL HIGH (ref 5–40)

## 2017-11-04 LAB — CBC
HEMOGLOBIN: 14.2 g/dL (ref 11.1–15.9)
Hematocrit: 41.5 % (ref 34.0–46.6)
MCH: 32.6 pg (ref 26.6–33.0)
MCHC: 34.2 g/dL (ref 31.5–35.7)
MCV: 95 fL (ref 79–97)
PLATELETS: 293 10*3/uL (ref 150–379)
RBC: 4.35 x10E6/uL (ref 3.77–5.28)
RDW: 12.4 % (ref 12.3–15.4)
WBC: 7.4 10*3/uL (ref 3.4–10.8)

## 2017-11-04 LAB — TSH: TSH: 2.34 u[IU]/mL (ref 0.450–4.500)

## 2018-02-27 IMAGING — US US ABDOMEN LIMITED
1 series · 14 of 25 positions shown · non-contrast
Comparison: CT abdomen dated 12/18/2016.

CLINICAL DATA: Right upper quadrant pain, nausea.

EXAM:
US ABDOMEN LIMITED - RIGHT UPPER QUADRANT

[Series 1: us abdomen limited · 0.18mm/px · 14 of 52 slices shown]
[im 1/52]
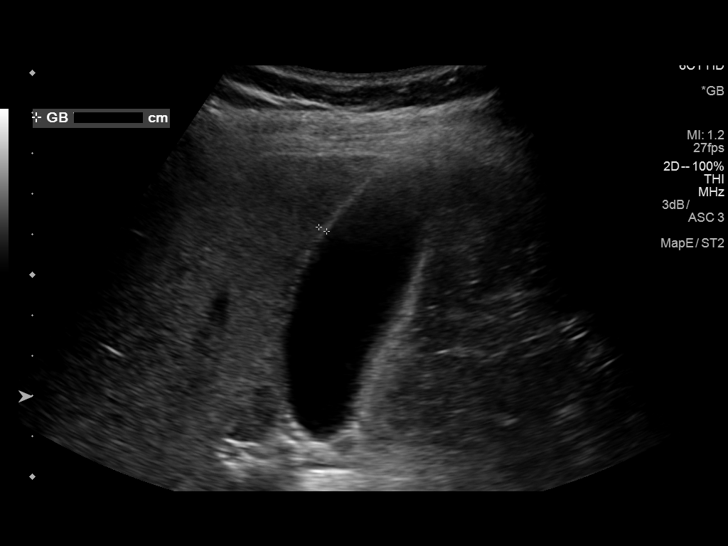
[im 5/52]
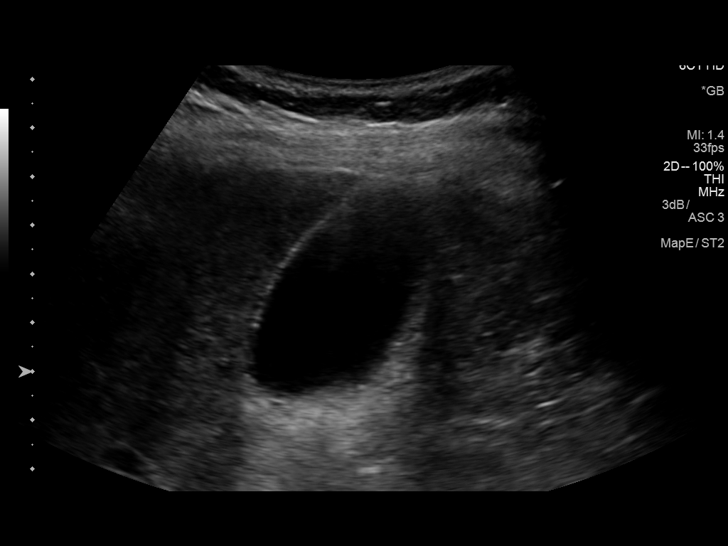
[im 9/52]
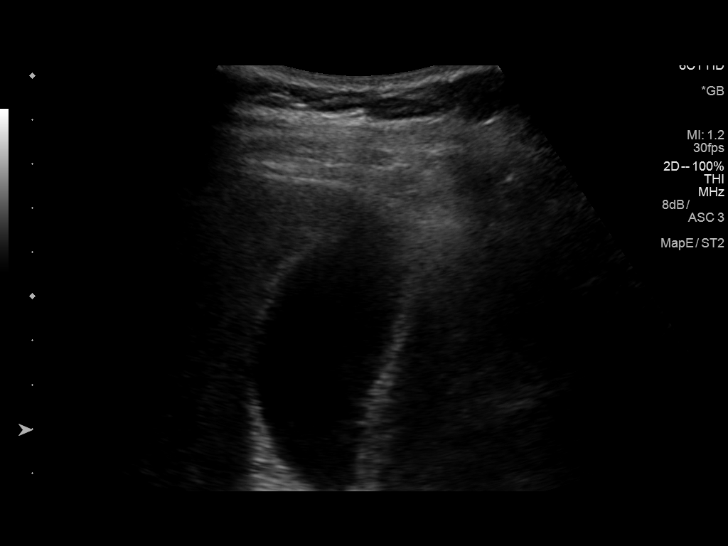
[im 13/52]
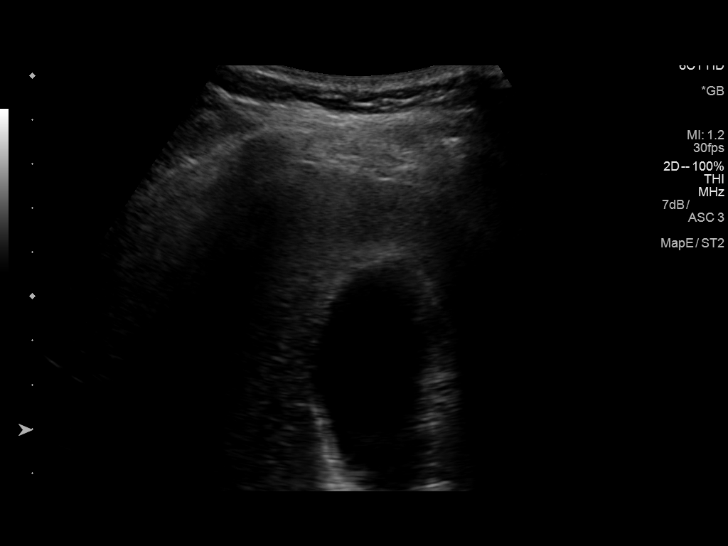
[im 18/52]
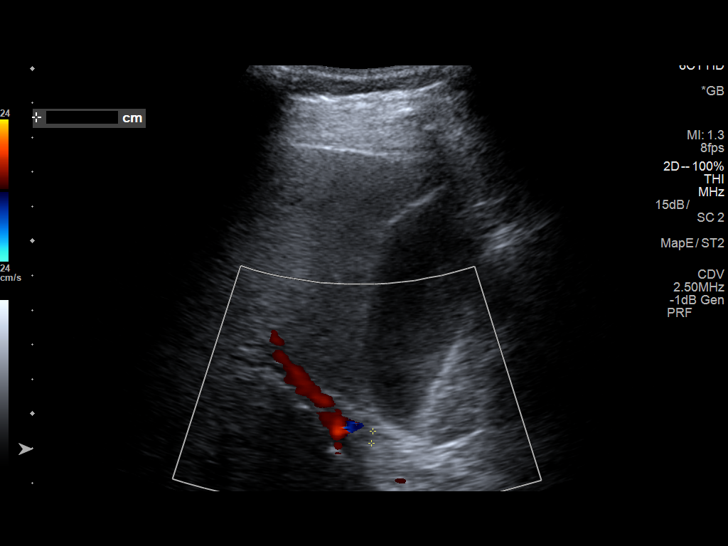
[im 20/52]
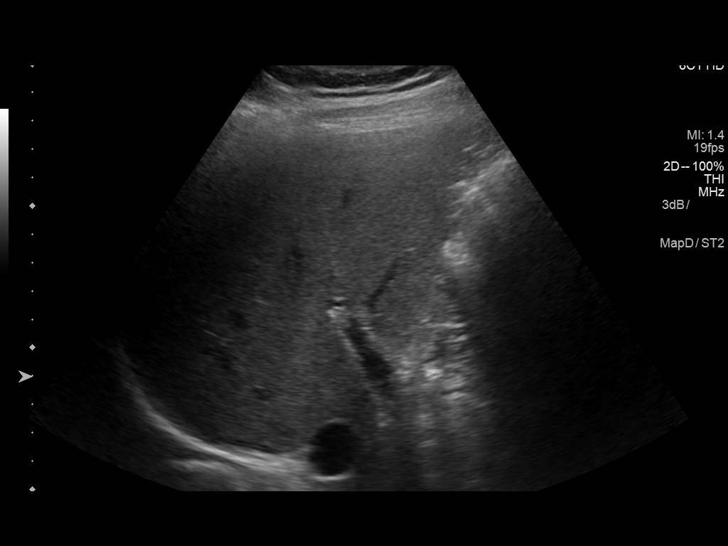
[im 24/52]
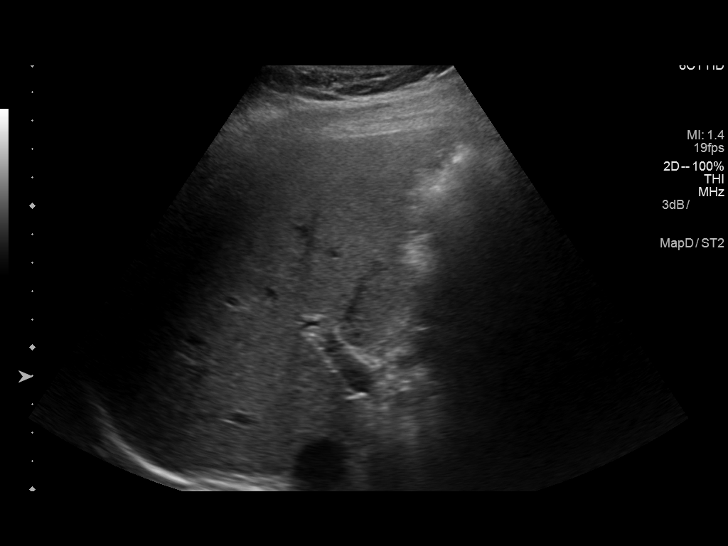
[im 28/52]
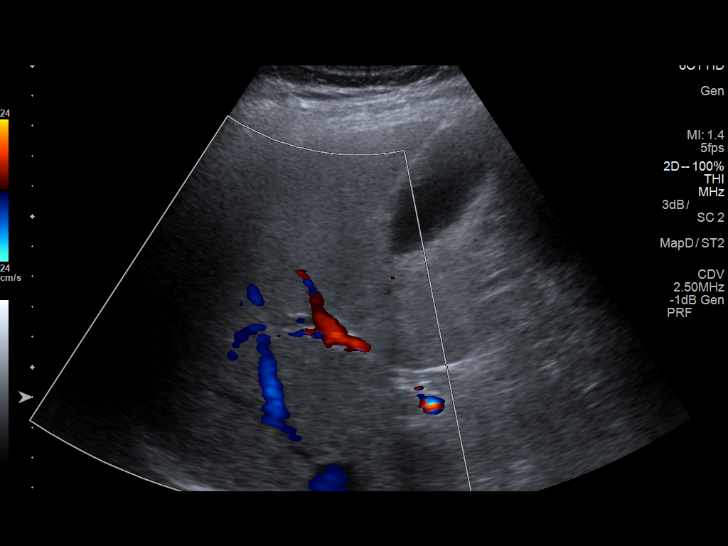
[im 32/52]
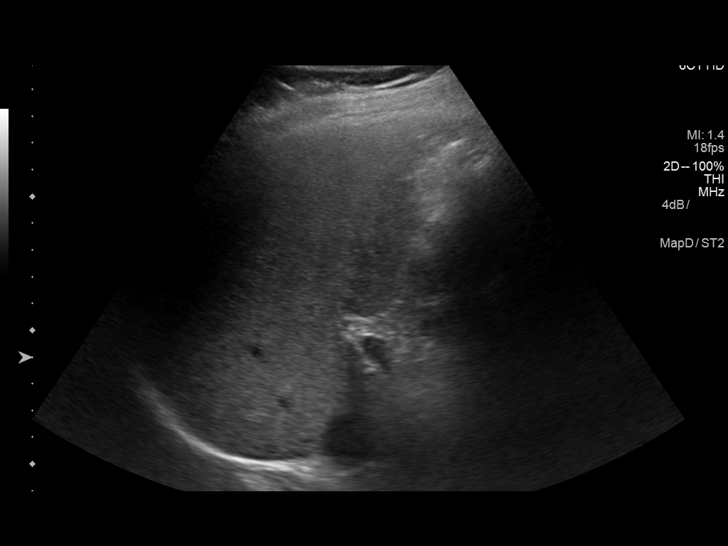
[im 35/52]
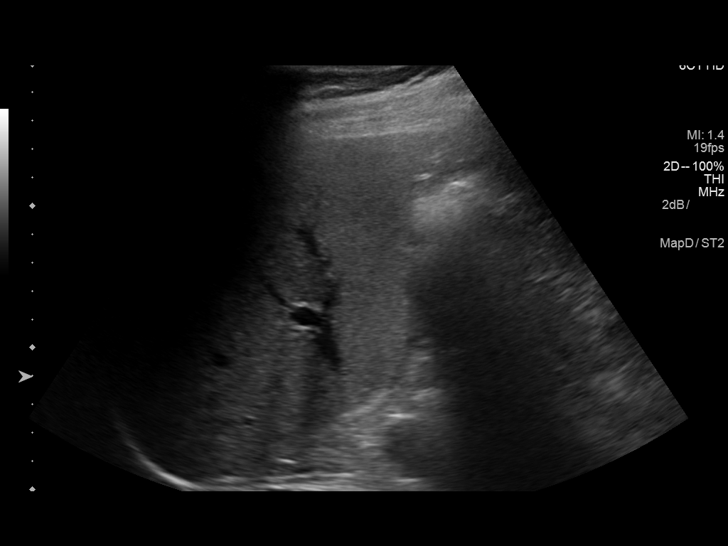
[im 39/52]
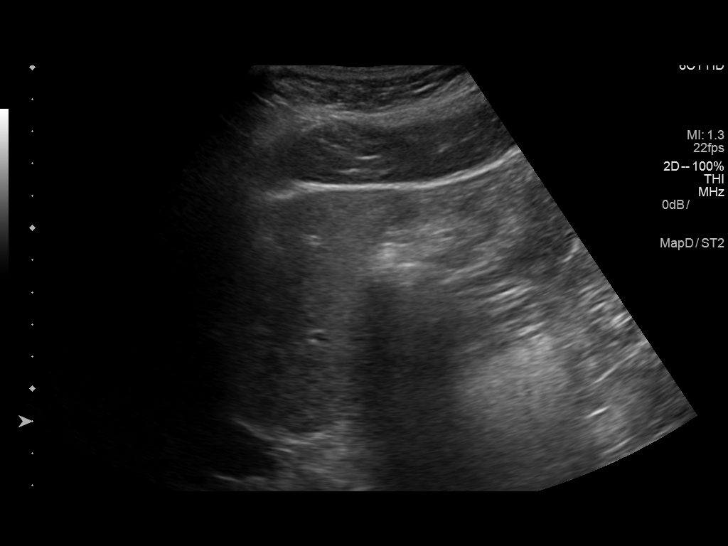
[im 43/52]
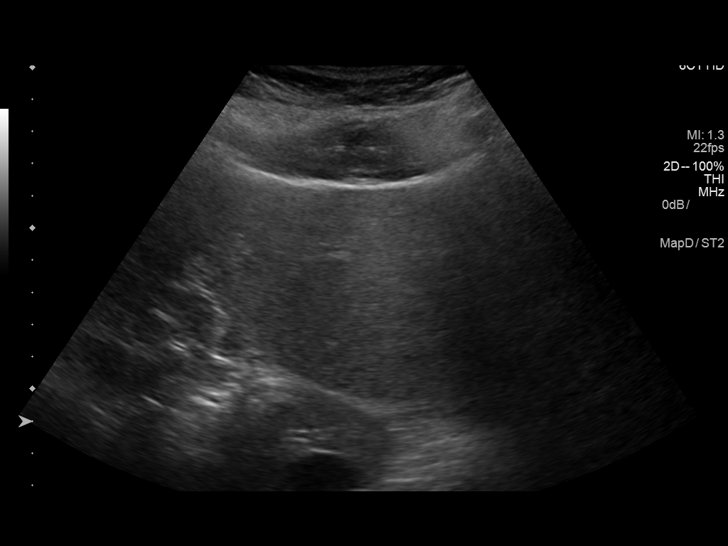
[im 47/52]
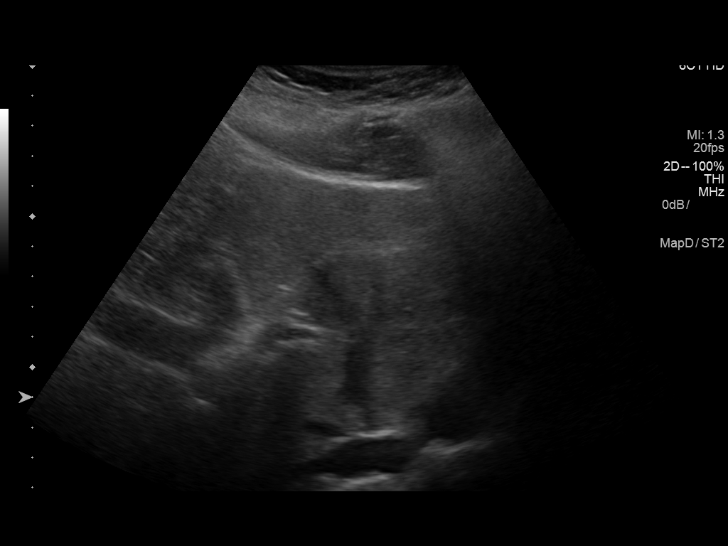
[im 52/52]
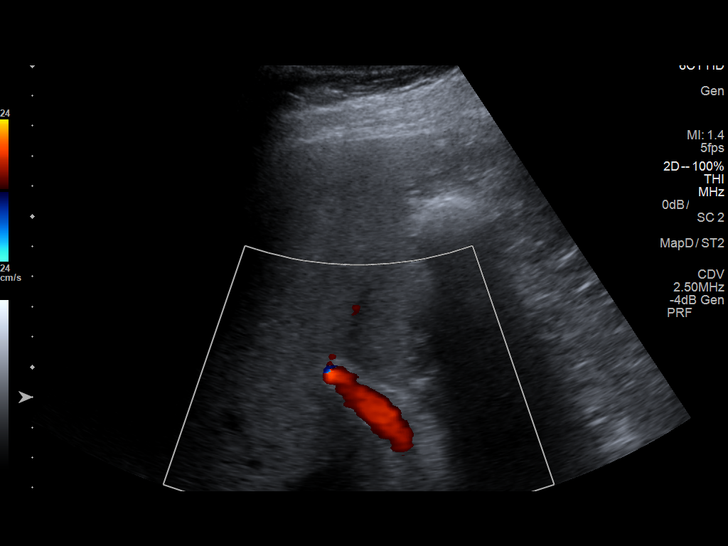

[14 of 25 positions shown; findings below may reference images not displayed]

FINDINGS: Gallbladder:

No gallstones or wall thickening visualized. No sonographic Murphy
sign noted by sonographer.

Common bile duct:

Diameter: 4 mm.

Liver:

No focal lesion identified. Within normal limits in parenchymal
echogenicity.
IMPRESSION: Normal right upper quadrant ultrasound.

## 2018-03-19 IMAGING — DX DG CHEST 2V
2 series · 2 of 2 positions shown · non-contrast
Comparison: None.

CLINICAL DATA: Right anterior chest pain since [REDACTED] with cough

EXAM:
CHEST  2 VIEW

[chest pa]
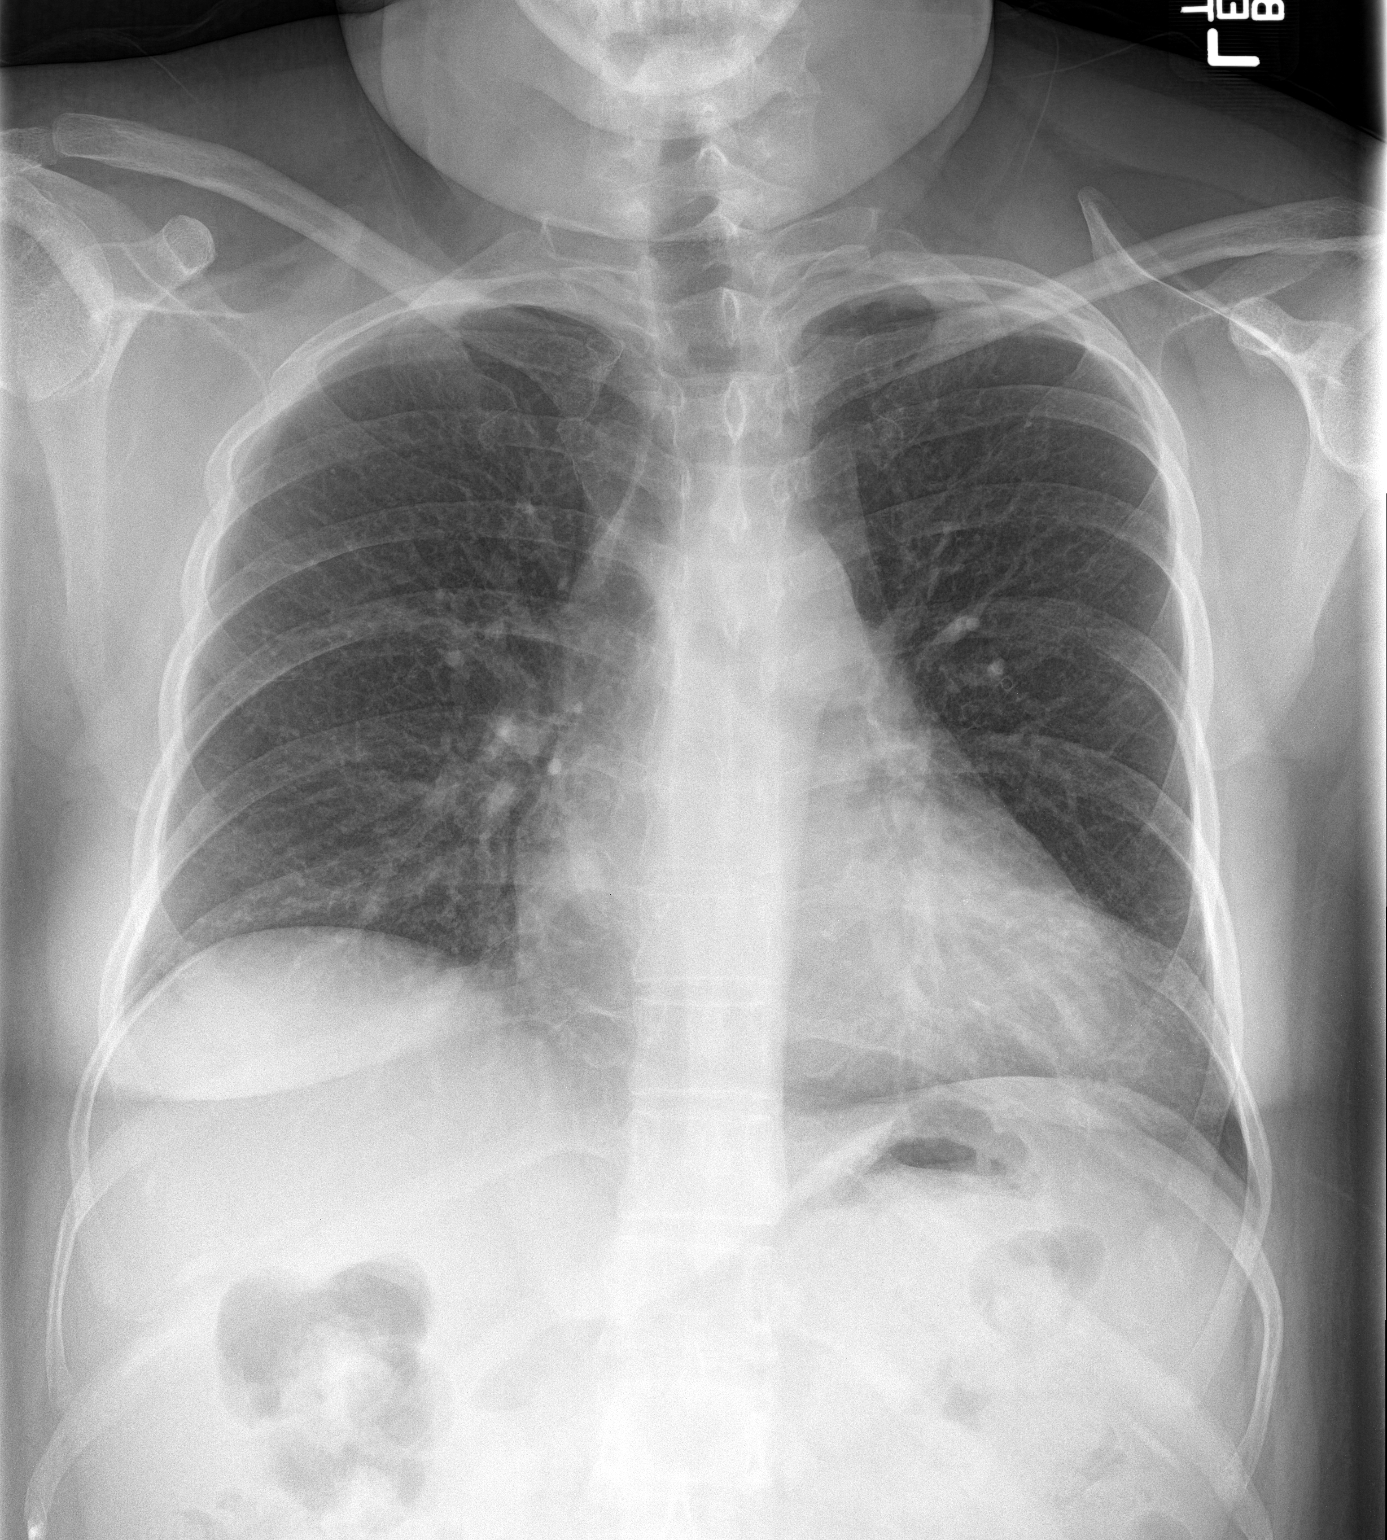

[chest lat]
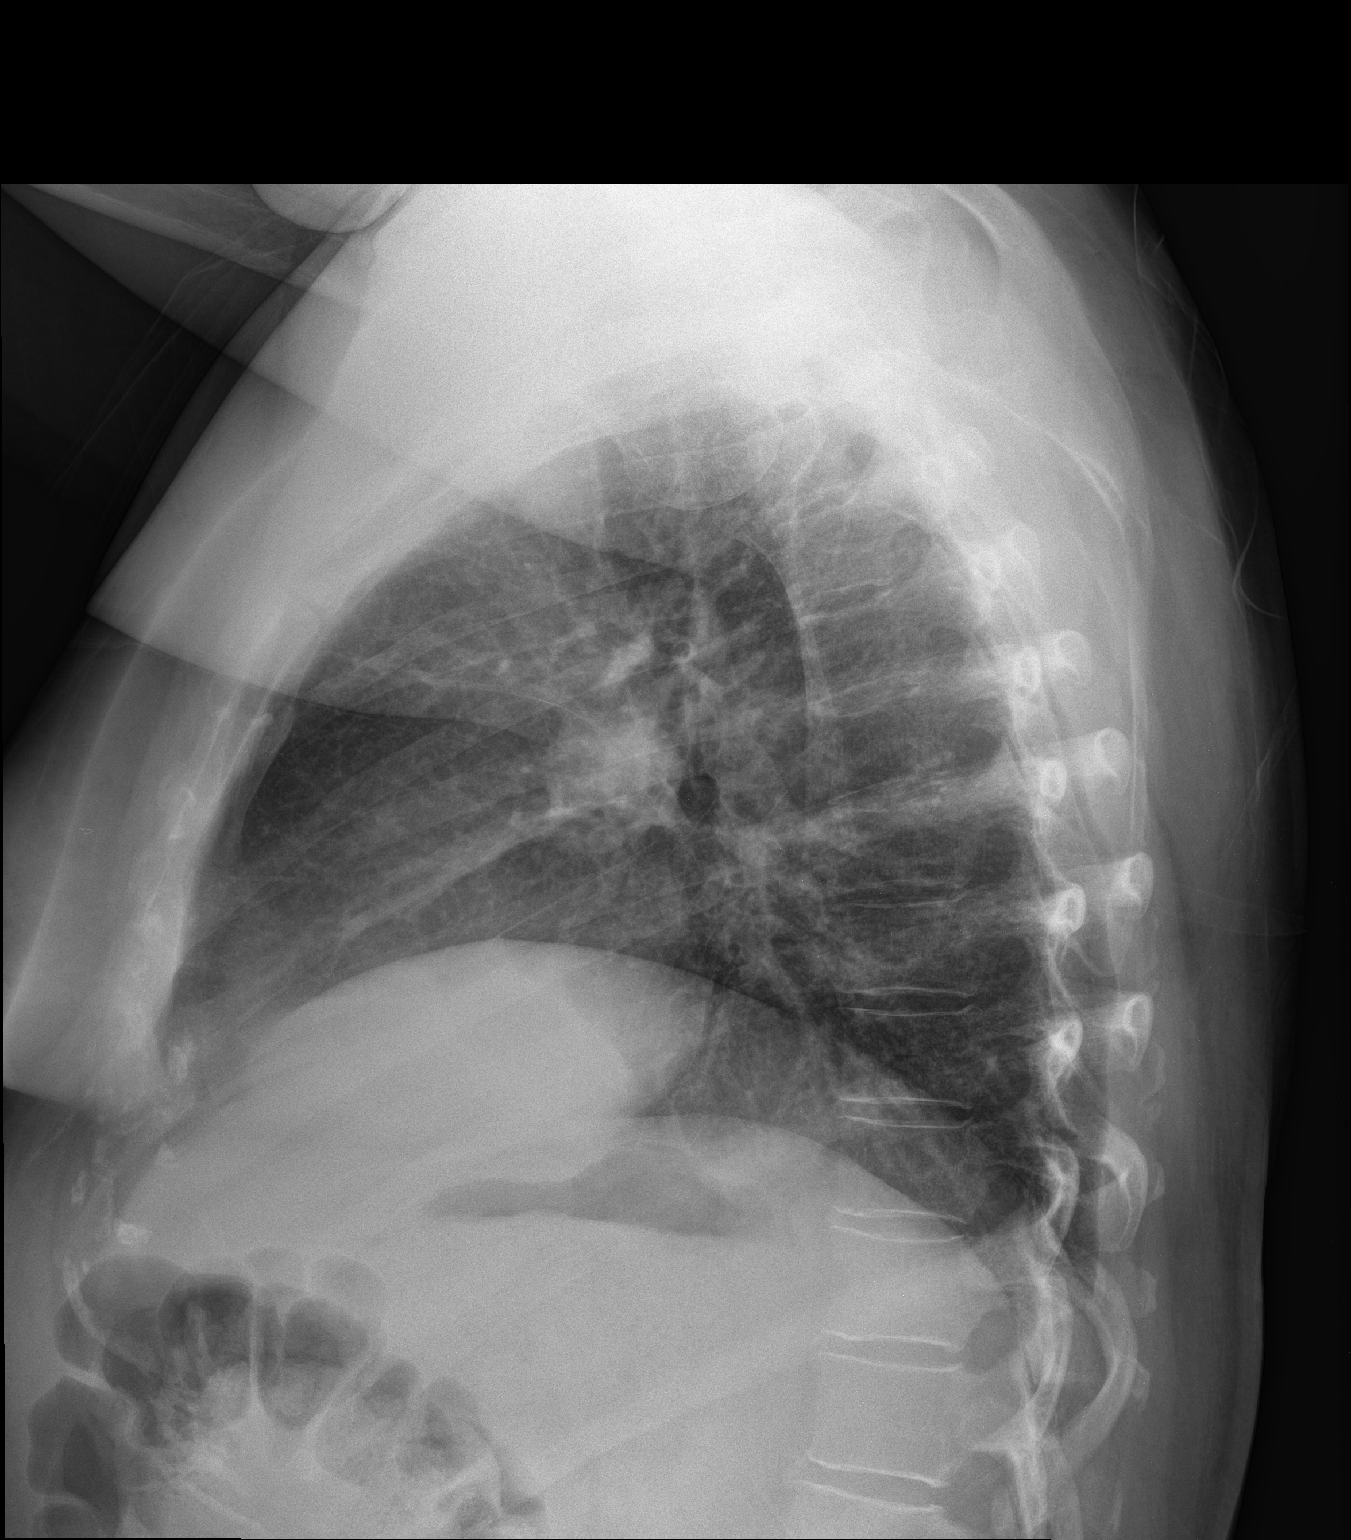

[2 of 2 positions shown; findings below may reference images not displayed]

FINDINGS: The heart size and mediastinal contours are within normal limits.
Both lungs are clear. The visualized skeletal structures are
unremarkable.
IMPRESSION: No active cardiopulmonary disease.

## 2018-06-29 DIAGNOSIS — J209 Acute bronchitis, unspecified: Secondary | ICD-10-CM | POA: Diagnosis not present

## 2018-06-29 DIAGNOSIS — Z6835 Body mass index (BMI) 35.0-35.9, adult: Secondary | ICD-10-CM | POA: Diagnosis not present

## 2020-10-29 ENCOUNTER — Ambulatory Visit: Payer: BLUE CROSS/BLUE SHIELD | Admitting: Women's Health

## 2021-11-27 ENCOUNTER — Other Ambulatory Visit (HOSPITAL_COMMUNITY): Payer: Self-pay | Admitting: Family Medicine

## 2021-12-03 ENCOUNTER — Ambulatory Visit (HOSPITAL_COMMUNITY)
Admission: RE | Admit: 2021-12-03 | Discharge: 2021-12-03 | Disposition: A | Discharge status: Home / Self Care | Payer: BC Managed Care – PPO | Source: Ambulatory Visit | Attending: Family Medicine | Admitting: Family Medicine

## 2021-12-03 ENCOUNTER — Other Ambulatory Visit: Payer: Self-pay

## 2021-12-03 DIAGNOSIS — Z1231 Encounter for screening mammogram for malignant neoplasm of breast: Secondary | ICD-10-CM | POA: Insufficient documentation

## 2022-11-16 ENCOUNTER — Other Ambulatory Visit (HOSPITAL_COMMUNITY): Payer: Self-pay | Admitting: Family Medicine

## 2022-11-16 DIAGNOSIS — Z1231 Encounter for screening mammogram for malignant neoplasm of breast: Secondary | ICD-10-CM

## 2022-12-07 ENCOUNTER — Ambulatory Visit (HOSPITAL_COMMUNITY): Payer: BC Managed Care – PPO

## 2022-12-28 ENCOUNTER — Ambulatory Visit (HOSPITAL_COMMUNITY)
Admission: RE | Admit: 2022-12-28 | Discharge: 2022-12-28 | Disposition: A | Discharge status: Home / Self Care | Payer: BC Managed Care – PPO | Source: Ambulatory Visit | Attending: Family Medicine | Admitting: Family Medicine

## 2022-12-28 DIAGNOSIS — Z1231 Encounter for screening mammogram for malignant neoplasm of breast: Secondary | ICD-10-CM | POA: Insufficient documentation

## 2023-11-29 ENCOUNTER — Other Ambulatory Visit (HOSPITAL_COMMUNITY): Payer: Self-pay | Admitting: Physician Assistant

## 2023-11-29 DIAGNOSIS — Z1231 Encounter for screening mammogram for malignant neoplasm of breast: Secondary | ICD-10-CM

## 2023-12-31 ENCOUNTER — Ambulatory Visit (HOSPITAL_COMMUNITY)

## 2024-01-10 ENCOUNTER — Inpatient Hospital Stay (HOSPITAL_COMMUNITY): Admission: RE | Admit: 2024-01-10 | Source: Ambulatory Visit

## 2024-01-31 ENCOUNTER — Ambulatory Visit (HOSPITAL_COMMUNITY)
Admission: RE | Admit: 2024-01-31 | Discharge: 2024-01-31 | Disposition: A | Discharge status: Home / Self Care | Source: Ambulatory Visit | Attending: Physician Assistant | Admitting: Physician Assistant

## 2024-01-31 ENCOUNTER — Encounter (HOSPITAL_COMMUNITY): Payer: Self-pay

## 2024-01-31 DIAGNOSIS — Z1231 Encounter for screening mammogram for malignant neoplasm of breast: Secondary | ICD-10-CM | POA: Insufficient documentation
